# Patient Record
Sex: Male | Born: 1935 | Hispanic: No | Smoking: Light tobacco smoker
Health system: Southern US, Community
[De-identification: ages and names within clinical notes are randomized; demographics above are authoritative.]

## PROBLEM LIST (undated history)

## (undated) DIAGNOSIS — C801 Malignant (primary) neoplasm, unspecified: Secondary | ICD-10-CM

## (undated) DIAGNOSIS — A809 Acute poliomyelitis, unspecified: Secondary | ICD-10-CM

## (undated) DIAGNOSIS — N183 Chronic kidney disease, stage 3 unspecified: Secondary | ICD-10-CM

## (undated) DIAGNOSIS — I10 Essential (primary) hypertension: Secondary | ICD-10-CM

## (undated) DIAGNOSIS — A0472 Enterocolitis due to Clostridium difficile, not specified as recurrent: Secondary | ICD-10-CM

## (undated) HISTORY — DX: Acute poliomyelitis, unspecified: A80.9

## (undated) HISTORY — DX: Enterocolitis due to Clostridium difficile, not specified as recurrent: A04.72

## (undated) HISTORY — DX: Chronic kidney disease, stage 3 (moderate): N18.3

## (undated) HISTORY — DX: Chronic kidney disease, stage 3 unspecified: N18.30

---

## 1961-03-08 HISTORY — PX: OTHER SURGICAL HISTORY: SHX169

## 2005-02-24 ENCOUNTER — Inpatient Hospital Stay (HOSPITAL_COMMUNITY): Admission: EM | Admit: 2005-02-24 | Discharge: 2005-02-27 | Payer: Self-pay | Admitting: Emergency Medicine

## 2015-03-09 DIAGNOSIS — C801 Malignant (primary) neoplasm, unspecified: Secondary | ICD-10-CM

## 2015-03-09 HISTORY — DX: Malignant (primary) neoplasm, unspecified: C80.1

## 2015-03-27 ENCOUNTER — Other Ambulatory Visit (HOSPITAL_COMMUNITY): Payer: Self-pay | Admitting: Urology

## 2015-03-27 DIAGNOSIS — C61 Malignant neoplasm of prostate: Secondary | ICD-10-CM

## 2015-04-07 ENCOUNTER — Encounter (HOSPITAL_COMMUNITY)
Admission: RE | Admit: 2015-04-07 | Discharge: 2015-04-07 | Disposition: A | Discharge status: Home / Self Care | Payer: Medicare Other | Source: Ambulatory Visit | Attending: Urology | Admitting: Urology

## 2015-04-07 DIAGNOSIS — C61 Malignant neoplasm of prostate: Secondary | ICD-10-CM | POA: Diagnosis present

## 2015-04-07 MED ORDER — TECHNETIUM TC 99M MEDRONATE IV KIT
25.0000 | PACK | Freq: Once | INTRAVENOUS | Status: AC | PRN
  Administered 2015-04-07: 25 via INTRAVENOUS

## 2015-05-12 ENCOUNTER — Other Ambulatory Visit: Payer: Self-pay | Admitting: Urology

## 2015-05-15 MED ORDER — KETOROLAC TROMETHAMINE 30 MG/ML IJ SOLN: 30.0000 mg | Freq: Once | INTRAMUSCULAR | Status: AC

## 2015-06-05 ENCOUNTER — Encounter (HOSPITAL_BASED_OUTPATIENT_CLINIC_OR_DEPARTMENT_OTHER): Payer: Self-pay | Admitting: *Deleted

## 2015-06-05 NOTE — Progress Notes (Signed)
To [facility] at 0900-Istat on arrival-requested Ekg and last office visit note from [place] after [place]-stated Marcus Lane, plans to be with him day of surgery  and stay with him post op.

## 2015-06-08 NOTE — H&P (Signed)
Reason For Visit F/u   Active Problems Problems  1. Elevated prostate specific antigen (PSA) (R97.20)   Assessed By: Carolan Clines (Urology); Last Assessed: 07 May 2015 2. Nocturia (R35.1)   Assessed By: Carolan Clines (Urology); Last Assessed: 07 May 2015 3. Prostate cancer (C61)   Assessed By: Carolan Clines (Urology); Last Assessed: 07 May 2015 4. Urinary urgency (R39.15)   Assessed By: Carolan Clines (Urology); Last Assessed: 07 May 2015 5. Weak urinary stream (R39.12)  History of Present Illness     82 yo male returns today because Myrbetriq did not help with LUTS. ( IPSS= 19, QOL=6) He would like to proceed with cryotherapy for hx of adenocarcinoma of the prostate: PSA in 10/16 was 26.95. DRE revealed a 4+ prostate with significant right lobe nodularity and possible palpable extension on the right side.     TRUS/BX 03/21/15: Prostate measured 71 cc. Pathology: Adenocarcinoma involving nearly all of his prostate with 11/12 cores positive for Gleason score 4+4 = 8 in all cores with the percentage involved being quite high. Clinical stage: T2b possible T3a    LUTS: He reported having significant voiding symptoms consisting of intermittency, urgency and weak stream, frequency and nocturia with an IPSS of 21/QOL - terrible. The symptoms have become progressively worse over a 10 month period.    He saw Dr. Karsten Ro as 2nd opinion. He is currently taking dutasteride and tamsulosin and tells me that he has not seen a great deal of change in his voiding pattern. While he does have a weak stream is overriding voiding symptoms he reports to me are those of frequency, urgency, urge incontinence and nocturia 3-4 times. He has no dysuria or hematuria.   Past Medical History Problems  1. History of gastric ulcer (Z87.19) 2. History of hypercholesterolemia (Z86.39) 3. History of hypertension (Z86.79)  Surgical History Problems  1. History of No Surgical  Problems  Current Meds 1. Allergy Relief TABS;  Therapy: (Recorded:09Dec2016) to Recorded 2. DiazePAM 10 MG Oral Tablet; Take tablet 1 hour prior to procedure;  Therapy: [id number] to (Last Rx:09Dec2016) Ordered 3. Dutasteride-Tamsulosin HCl - 0.5-0.4 MG Oral Capsule; TAKE 1 CAPSULE EVERY DAY;  Therapy: [id number] to (Evaluate:07Jun2017)  Requested for: [id number]; Last  Rx:09Dec2016 Ordered 4. LevoFLOXacin 500 MG Oral Tablet; 1 tablet the day before the procedure, 1 tablet the day  of the procedure, and 1 tablet the day after the procedure;  Therapy: 12Dec2016 to (Last Rx:12Dec2016)  Requested for: 13Dec2016 Ordered 5. Lisinopril 20 MG Oral Tablet;  Therapy: (Recorded:09Dec2016) to Recorded 6. Lovastatin 20 MG Oral Tablet;  Therapy: (Recorded:09Dec2016) to Recorded 7. Vitamin D3 1000 UNIT Oral Tablet;  Therapy: (Recorded:09Dec2016) to Recorded  Allergies Medication  1. Allergy TABS  Family History Problems  1. Family history of Deceased : Mother, Father 2. Family history of diabetes mellitus (Z83.3) : Mother  Social History Problems  1. Alcohol use (Z78.9)   2 per day 2. Caffeine use (F15.90)   4 per day 3. Current every day smoker (F17.200) 4. Number of children   5 sons 5. Retired 83. Separated from significant other (Z63.5) 7. Smokes cigarettes (F17.210)   1 ppd x 4yr  Review of Systems Genitourinary and gastrointestinal system(s) were reviewed and pertinent findings if present are noted and are otherwise negative.  Genitourinary: urinary frequency, feelings of urinary urgency, nocturia, weak urinary stream and urinary stream starts and stops.    Vitals Vital Signs [Data Includes: Last 1 Day]  Recorded: [id number]:07AM  Blood Pressure: 175 /  87 Temperature: 98.2 F Heart Rate: 63  Physical Exam Constitutional: Well nourished and well developed . No acute distress.  ENT:. The ears and nose are normal in appearance.  Neck: The appearance of the neck is  normal and no neck mass is present.  Pulmonary: No respiratory distress and normal respiratory rhythm and effort.  Cardiovascular: Heart rate and rhythm are normal . No peripheral edema.  Abdomen: The abdomen is soft and nontender. No masses are palpated. No CVA tenderness. No hernias are palpable. No hepatosplenomegaly noted.  Rectal: Rectal exam demonstrates normal sphincter tone and the anus is normal on inspection. Estimated prostate size is 4+. Bilobar prostatic nodularity in 4+ gland. The prostate has a palpable nodule involving the entire right lobe  of the prostate and is concerning for extension beyond the prostate capsule, is not indurated, is not tender and is not fluctuant. The left seminal vesicle is nonpalpable. The right seminal vesicle is nonpalpable.  Genitourinary: Examination of the penis demonstrates no discharge, no masses, no lesions and a normal meatus. The scrotum is without lesions. The right epididymis is palpably normal and non-tender. The left epididymis is palpably normal and non-tender. The right testis is non-tender and without masses. The left testis is non-tender and without masses.  Lymphatics: The femoral and inguinal nodes are not enlarged or tender.  Skin: Normal skin turgor, no visible rash and no visible skin lesions.  Neuro/Psych:. Mood and affect are appropriate.    Results/Data Selected Results  CT-ABD/PELVIS W/W/O CONTRAST [id number] 12:00AM Carolan Clines   Test Name Result Flag Reference  CT-ABD/PELVIS W/W/O CONTRAST (Report)    ** RADIOLOGY REPORT BY [facility] **   CLINICAL DATA: Prostate cancer, elevated PSA, positive biopsy 03/11/2015.  EXAM: CT ABDOMEN AND PELVIS WITHOUT AND WITH CONTRAST  TECHNIQUE: Multidetector CT imaging of the abdomen and pelvis was performed following the standard protocol before and following the bolus administration of intravenous contrast.  CONTRAST: 100 cc Isovue-300.  COMPARISON:  None.  FINDINGS: Lower chest: Lung bases show no acute findings. Heart size normal. No pericardial or pleural effusion.  Hepatobiliary: Liver and gallbladder are unremarkable. No biliary ductal dilatation.  Pancreas: Negative.  Spleen: Negative.  Adrenals/Urinary Tract: Adrenal glands and kidneys are unremarkable. Ureters are decompressed. Anterior bladder wall may be slightly thickened but the bladder is low in volume.  Stomach/Bowel: Stomach, small bowel, appendix and colon are unremarkable. Note is made of stool in the majority of the colon, indicative of constipation. Small left inguinal hernia contains unobstructed small bowel.  Vascular/Lymphatic: Atherosclerotic calcification of the arterial vasculature without abdominal aortic aneurysm. There are scattered lymph nodes in the abdomen and pelvis which are not enlarged by CT size criteria.  Reproductive: Prostate is enlarged and indents the bladder.  Other: Left inguinal hernia contains unobstructed small bowel and fluid. No free fluid.  Musculoskeletal: No worrisome lytic or sclerotic lesions. Degenerative changes are seen in the spine.  IMPRESSION: 1. Enlarged prostate. No evidence of metastatic disease. 2. Left inguinal hernia contains unobstructed small bowel and a small amount of fluid.   Electronically Signed  By: Lorin Picket M.D.  On: 04/07/2015 12:58   CREATININE with eGFR [id number] 04:57PM Carolan Clines  SPECIMEN TYPE: BLOOD   Test Name Result Flag Reference  CREATININE 1.11 mg/dL  0.50-1.50  Est GFR, African American 72 mL/min  >=60  Est GFR, NonAfrican American 62 mL/min  >=60  THE ESTIMATED GFR IS A CALCULATION VALID FOR ADULTS (>=66 YEARS OLD) THAT USES THE CKD-EPI  ALGORITHM TO ADJUST FOR AGE AND SEX. IT IS   NOT TO BE USED FOR CHILDREN, PREGNANT WOMEN, HOSPITALIZED PATIENTS,    PATIENTS ON DIALYSIS, OR WITH RAPIDLY CHANGING KIDNEY FUNCTION. ACCORDING TO THE NKDEP, EGFR >89 IS NORMAL,  60-89 SHOWS MILD IMPAIRMENT, 30-59 SHOWS MODERATE IMPAIRMENT, 15-29 SHOWS SEVERE IMPAIRMENT AND <15 IS ESRD.   VITAMIN D [id number] 04:57PM Carolan Clines  SPECIMEN TYPE: BLOOD   Test Name Result Flag Reference  Vitamin D (25-Hydroxy) 27 ng/mL L 30-100  VITAMIN D STATUS           25-OH VITAMIN D        DEFICIENCY                <20 NG/ML        INSUFFICIENCY         20 - 29 NG/ML        OPTIMAL             > OR = 30 NG/ML   FOR 25-OH VITAMIN D TESTING ON PATIENTS ON D2-SUPPLEMENTATION AND PATIENTS FOR WHOM QUANTITATION OF D2 AND D3 FRACTIONS IS REQUIRED, THE QUESTASSURED 25-OH VIT D, (D2,D3), LC/MS/MS IS RECOMMENDED: ORDER CODE [id number] (PATIENTS > 2 YRS).   PSA [id number] 04:30PM Carolan Clines  SPECIMEN TYPE: BLOOD   Test Name Result Flag Reference  PSA 26.34 ng/mL H <=4.00  TEST METHODOLOGY: ECLIA PSA (ELECTROCHEMILUMINESCENCE IMMUNOASSAY)   TESTOSTERONE [id number] 04:30PM Carolan Clines  SPECIMEN TYPE: BLOOD   Test Name Result Flag Reference  TESTOSTERONE, TOTAL 488 ng/dL  300-890  TANNER STAGE       MALE              MALE               I              < 30 NG/DL        < 10 NG/DL               II             < 150 NG/DL       < 30 NG/DL               III            100-320 NG/DL     < 35 NG/DL               IV             200-970 NG/DL     15-40 NG/DL               V/ADULT        300-890 NG/DL     10-70 NG/DL   Assessment Assessed  1. Weak urinary stream (R39.12) 2. Urinary urgency (R39.15) 3. Nocturia (R35.1) 4. Prostate cancer (C61) 5. Elevated prostate specific antigen (PSA) (R97.20)  82 yo male with CaP. He has significant bladder outlet obstruction. He has had 2nd opinion to evaluate him for options, and he has selected cryotherapy. He understands that he will have a foley catheter for 2 days or more post-op, and RTC for voiding trial. He does not think his son will be able to return from [place] to be with him, so he plan to take [place] home from hospital.  ? needs overnight stay.   Plan Cryotherapy. Possible overnight stay.   Signatures Electronically signed by : Carolan Clines, M.D.; May 07 2015 10:09AM EST

## 2015-06-09 ENCOUNTER — Other Ambulatory Visit: Payer: Self-pay

## 2015-06-09 ENCOUNTER — Ambulatory Visit (HOSPITAL_BASED_OUTPATIENT_CLINIC_OR_DEPARTMENT_OTHER): Payer: Medicare Other | Admitting: Anesthesiology

## 2015-06-09 ENCOUNTER — Encounter (HOSPITAL_BASED_OUTPATIENT_CLINIC_OR_DEPARTMENT_OTHER): Payer: Self-pay | Admitting: Anesthesiology

## 2015-06-09 ENCOUNTER — Ambulatory Visit (HOSPITAL_BASED_OUTPATIENT_CLINIC_OR_DEPARTMENT_OTHER)
Admission: RE | Admit: 2015-06-09 | Discharge: 2015-06-09 | Disposition: A | Discharge status: Home / Self Care | Payer: Medicare Other | Source: Ambulatory Visit | Attending: Urology | Admitting: Urology

## 2015-06-09 ENCOUNTER — Encounter (HOSPITAL_BASED_OUTPATIENT_CLINIC_OR_DEPARTMENT_OTHER)
Admission: RE | Disposition: A | Discharge status: Home / Self Care | Payer: Self-pay | Source: Ambulatory Visit | Attending: Urology

## 2015-06-09 DIAGNOSIS — R350 Frequency of micturition: Secondary | ICD-10-CM | POA: Diagnosis not present

## 2015-06-09 DIAGNOSIS — N3941 Urge incontinence: Secondary | ICD-10-CM | POA: Diagnosis not present

## 2015-06-09 DIAGNOSIS — R351 Nocturia: Secondary | ICD-10-CM | POA: Diagnosis not present

## 2015-06-09 DIAGNOSIS — N401 Enlarged prostate with lower urinary tract symptoms: Secondary | ICD-10-CM | POA: Insufficient documentation

## 2015-06-09 DIAGNOSIS — Z79899 Other long term (current) drug therapy: Secondary | ICD-10-CM | POA: Insufficient documentation

## 2015-06-09 DIAGNOSIS — R972 Elevated prostate specific antigen [PSA]: Secondary | ICD-10-CM | POA: Diagnosis not present

## 2015-06-09 DIAGNOSIS — F17210 Nicotine dependence, cigarettes, uncomplicated: Secondary | ICD-10-CM | POA: Diagnosis not present

## 2015-06-09 DIAGNOSIS — E7800 Pure hypercholesterolemia, unspecified: Secondary | ICD-10-CM | POA: Diagnosis not present

## 2015-06-09 DIAGNOSIS — R3912 Poor urinary stream: Secondary | ICD-10-CM | POA: Insufficient documentation

## 2015-06-09 DIAGNOSIS — I10 Essential (primary) hypertension: Secondary | ICD-10-CM | POA: Insufficient documentation

## 2015-06-09 DIAGNOSIS — N138 Other obstructive and reflux uropathy: Secondary | ICD-10-CM | POA: Diagnosis not present

## 2015-06-09 DIAGNOSIS — C61 Malignant neoplasm of prostate: Secondary | ICD-10-CM | POA: Diagnosis present

## 2015-06-09 DIAGNOSIS — K4090 Unilateral inguinal hernia, without obstruction or gangrene, not specified as recurrent: Secondary | ICD-10-CM | POA: Insufficient documentation

## 2015-06-09 HISTORY — DX: Essential (primary) hypertension: I10

## 2015-06-09 HISTORY — PX: CRYOABLATION: SHX1415

## 2015-06-09 HISTORY — DX: Malignant (primary) neoplasm, unspecified: C80.1

## 2015-06-09 LAB — POCT I-STAT 4, (NA,K, GLUC, HGB,HCT)
GLUCOSE: 90 mg/dL (ref 65–99)
HEMATOCRIT: 43 % (ref 39.0–52.0)
HEMOGLOBIN: 14.6 g/dL (ref 13.0–17.0)
POTASSIUM: 3.1 mmol/L — AB (ref 3.5–5.1)
SODIUM: 143 mmol/L (ref 135–145)

## 2015-06-09 SURGERY — CRYOABLATION, PROSTATE
Anesthesia: General | Site: Prostate

## 2015-06-09 MED ORDER — ONDANSETRON HCL 4 MG/2ML IJ SOLN
INTRAMUSCULAR | Status: DC | PRN
  Administered 2015-06-09: 4 mg via INTRAVENOUS

## 2015-06-09 MED ORDER — EPHEDRINE SULFATE 50 MG/ML IJ SOLN
INTRAMUSCULAR | Status: DC | PRN
  Administered 2015-06-09 (×2): 10 mg via INTRAVENOUS

## 2015-06-09 MED ORDER — STERILE WATER FOR IRRIGATION IR SOLN
Status: DC | PRN
  Administered 2015-06-09: 500 mL

## 2015-06-09 MED ORDER — DEXAMETHASONE SODIUM PHOSPHATE 4 MG/ML IJ SOLN
INTRAMUSCULAR | Status: DC | PRN
  Administered 2015-06-09: 5 mg via INTRAVENOUS

## 2015-06-09 MED ORDER — BELLADONNA ALKALOIDS-OPIUM 16.2-60 MG RE SUPP
RECTAL | Status: AC
  Filled 2015-06-09: qty 1

## 2015-06-09 MED ORDER — DEXAMETHASONE SODIUM PHOSPHATE 10 MG/ML IJ SOLN
INTRAMUSCULAR | Status: AC
  Filled 2015-06-09: qty 1

## 2015-06-09 MED ORDER — LIDOCAINE HCL (CARDIAC) 20 MG/ML IV SOLN
INTRAVENOUS | Status: DC | PRN
  Administered 2015-06-09: 60 mg via INTRAVENOUS

## 2015-06-09 MED ORDER — KETOROLAC TROMETHAMINE 30 MG/ML IJ SOLN
INTRAMUSCULAR | Status: AC
  Filled 2015-06-09: qty 1

## 2015-06-09 MED ORDER — FENTANYL CITRATE (PF) 100 MCG/2ML IJ SOLN
INTRAMUSCULAR | Status: AC
  Filled 2015-06-09: qty 2

## 2015-06-09 MED ORDER — KETOROLAC TROMETHAMINE 30 MG/ML IJ SOLN
INTRAMUSCULAR | Status: DC | PRN
  Administered 2015-06-09: 15 mg via INTRAVENOUS

## 2015-06-09 MED ORDER — TRAMADOL HCL 50 MG PO TABS
ORAL_TABLET | ORAL | Status: AC
  Filled 2015-06-09: qty 1

## 2015-06-09 MED ORDER — TRIMETHOPRIM 100 MG PO TABS: 100.0000 mg | ORAL_TABLET | Freq: Every day | ORAL | Status: DC

## 2015-06-09 MED ORDER — PROPOFOL 10 MG/ML IV BOLUS
INTRAVENOUS | Status: DC | PRN
  Administered 2015-06-09: 120 mg via INTRAVENOUS
  Administered 2015-06-09: 10 mg via INTRAVENOUS

## 2015-06-09 MED ORDER — LIDOCAINE HCL (CARDIAC) 20 MG/ML IV SOLN
INTRAVENOUS | Status: AC
  Filled 2015-06-09: qty 5

## 2015-06-09 MED ORDER — ONDANSETRON HCL 4 MG/2ML IJ SOLN
INTRAMUSCULAR | Status: AC
  Filled 2015-06-09: qty 2

## 2015-06-09 MED ORDER — PHENAZOPYRIDINE HCL 100 MG PO TABS
ORAL_TABLET | ORAL | Status: AC
  Filled 2015-06-09: qty 2

## 2015-06-09 MED ORDER — PHENAZOPYRIDINE HCL 200 MG PO TABS
200.0000 mg | ORAL_TABLET | Freq: Once | ORAL | Status: AC
  Administered 2015-06-09: 200 mg via ORAL
  Filled 2015-06-09: qty 1

## 2015-06-09 MED ORDER — SODIUM CHLORIDE 0.9 % IR SOLN
Status: DC | PRN
  Administered 2015-06-09: 3000 mL
  Administered 2015-06-09: 1000 mL

## 2015-06-09 MED ORDER — FENTANYL CITRATE (PF) 100 MCG/2ML IJ SOLN
INTRAMUSCULAR | Status: DC | PRN
  Administered 2015-06-09 (×5): 25 ug via INTRAVENOUS
  Administered 2015-06-09: 50 ug via INTRAVENOUS
  Administered 2015-06-09: 25 ug via INTRAVENOUS

## 2015-06-09 MED ORDER — DEXTROSE 5 % IV SOLN
2.0000 g | INTRAVENOUS | Status: AC
  Administered 2015-06-09: 2 g via INTRAVENOUS
  Filled 2015-06-09: qty 20

## 2015-06-09 MED ORDER — TRAMADOL-ACETAMINOPHEN 37.5-325 MG PO TABS: 1.0000 | ORAL_TABLET | Freq: Four times a day (QID) | ORAL | Status: DC | PRN

## 2015-06-09 MED ORDER — CEFAZOLIN SODIUM-DEXTROSE 2-3 GM-% IV SOLR
INTRAVENOUS | Status: AC
  Filled 2015-06-09: qty 50

## 2015-06-09 MED ORDER — GLYCOPYRROLATE 0.2 MG/ML IJ SOLN
INTRAMUSCULAR | Status: DC | PRN
  Administered 2015-06-09: 0.2 mg via INTRAVENOUS

## 2015-06-09 MED ORDER — PHENAZOPYRIDINE HCL 200 MG PO TABS: 200.0000 mg | ORAL_TABLET | Freq: Three times a day (TID) | ORAL | Status: DC | PRN

## 2015-06-09 MED ORDER — BUPIVACAINE HCL (PF) 0.25 % IJ SOLN
INTRAMUSCULAR | Status: AC
  Filled 2015-06-09: qty 30

## 2015-06-09 MED ORDER — ACETAMINOPHEN 500 MG PO TABS
ORAL_TABLET | ORAL | Status: AC
  Filled 2015-06-09: qty 2

## 2015-06-09 MED ORDER — LACTATED RINGERS IV SOLN
INTRAVENOUS | Status: DC
  Administered 2015-06-09 (×2): via INTRAVENOUS
  Filled 2015-06-09: qty 1000

## 2015-06-09 MED ORDER — TRAMADOL HCL 50 MG PO TABS
50.0000 mg | ORAL_TABLET | Freq: Once | ORAL | Status: AC
  Administered 2015-06-09: 50 mg via ORAL
  Filled 2015-06-09: qty 1

## 2015-06-09 MED ORDER — FENTANYL CITRATE (PF) 100 MCG/2ML IJ SOLN
25.0000 ug | INTRAMUSCULAR | Status: DC | PRN
  Administered 2015-06-09: 50 ug via INTRAVENOUS
  Filled 2015-06-09: qty 1

## 2015-06-09 MED ORDER — PROPOFOL 10 MG/ML IV BOLUS
INTRAVENOUS | Status: AC
  Filled 2015-06-09: qty 20

## 2015-06-09 MED ORDER — EPHEDRINE SULFATE 50 MG/ML IJ SOLN
INTRAMUSCULAR | Status: AC
  Filled 2015-06-09: qty 1

## 2015-06-09 MED ORDER — ACETAMINOPHEN 500 MG PO TABS
1000.0000 mg | ORAL_TABLET | Freq: Four times a day (QID) | ORAL | Status: DC | PRN
  Administered 2015-06-09 (×2): 1000 mg via ORAL
  Filled 2015-06-09: qty 2

## 2015-06-09 SURGICAL SUPPLY — 38 items
BAG DRN ANRFLXCHMBR STRAP LEK (BAG)
BAG URINE DRAINAGE (UROLOGICAL SUPPLIES) IMPLANT
BAG URINE LEG 19OZ MD ST LTX (BAG) IMPLANT
BLADE CLIPPER SURG (BLADE) ×3 IMPLANT
BNDG CONFORM 3 STRL LF (GAUZE/BANDAGES/DRESSINGS) IMPLANT
BOOTIES KNEE HIGH SLOAN (MISCELLANEOUS) ×3 IMPLANT
CATH FOLEY 2WAY SLVR  5CC 16FR (CATHETERS) ×2
CATH FOLEY 2WAY SLVR 5CC 16FR (CATHETERS) IMPLANT
CHARGE TECH PROCEDURE ONCURA (LABOR (TRAVEL & OVERTIME)) ×3 IMPLANT
CLOTH BEACON ORANGE TIMEOUT ST (SAFETY) ×3 IMPLANT
COVER BACK TABLE 60X90IN (DRAPES) ×3 IMPLANT
COVER MAYO STAND STRL (DRAPES) ×3 IMPLANT
DRAPE INCISE IOBAN 66X45 STRL (DRAPES) ×3 IMPLANT
DRSG TEGADERM 4X4.75 (GAUZE/BANDAGES/DRESSINGS) ×3 IMPLANT
DRSG TEGADERM 8X12 (GAUZE/BANDAGES/DRESSINGS) ×3 IMPLANT
GAS ARGON HIGH PRESSURE (MEDICAL GASES) ×3 IMPLANT
GAS HELIUM HIGH PRESSURE (MEDICAL GASES) ×3 IMPLANT
GAUZE SPONGE 4X4 12PLY STRL (GAUZE/BANDAGES/DRESSINGS) IMPLANT
GLOVE BIO SURGEON STRL SZ7.5 (GLOVE) ×3 IMPLANT
GLOVE INDICATOR 7.5 STRL GRN (GLOVE) ×4 IMPLANT
GLOVE SURG SS PI 7.5 STRL IVOR (GLOVE) ×6 IMPLANT
GUIDEWIRE SUPER STIFF (WIRE) ×3 IMPLANT
HOLDER FOLEY CATH W/STRAP (MISCELLANEOUS) ×3 IMPLANT
KIT CRYO ENDOCARE (DISPOSABLE) ×2 IMPLANT
KIT PROSTATE PRESICE I (KITS) IMPLANT
KIT ROOM TURNOVER WOR (KITS) ×3 IMPLANT
MANIFOLD NEPTUNE II (INSTRUMENTS) IMPLANT
NEEDLE SPNL 18GX3.5 QUINCKE PK (NEEDLE) IMPLANT
PACK CYSTOSCOPY (CUSTOM PROCEDURE TRAY) ×3 IMPLANT
PLUG CATH AND CAP STER (CATHETERS) ×3 IMPLANT
SHEET LAVH (DRAPES) ×3 IMPLANT
SPONGE GAUZE 4X4 12PLY STER LF (GAUZE/BANDAGES/DRESSINGS) ×3 IMPLANT
SYRINGE 10CC LL (SYRINGE) ×2 IMPLANT
SYRINGE IRR TOOMEY STRL 70CC (SYRINGE) IMPLANT
TUBE CONNECTING 12'X1/4 (SUCTIONS)
TUBE CONNECTING 12X1/4 (SUCTIONS) IMPLANT
UNDERPAD 30X30 INCONTINENT (UNDERPADS AND DIAPERS) ×3 IMPLANT
WATER STERILE IRR 500ML POUR (IV SOLUTION) ×3 IMPLANT

## 2015-06-09 NOTE — Interval H&P Note (Signed)
History and Physical Interval Note:  06/09/2015 10:03 AM  Marcus Lane  has presented today for surgery, with the diagnosis of PROSTATE CANCER  The various methods of treatment have been discussed with the patient and family. After consideration of risks, benefits and other options for treatment, the patient has consented to  Procedure(s): CRYO ABLATION PROSTATE (N/A) as a surgical intervention .  The patient's history has been reviewed, patient examined, no change in status, stable for surgery.  I have reviewed the patient's chart and labs.  Questions were answered to the patient's satisfaction.     Praneel Haisley I Valin Massie

## 2015-06-09 NOTE — Anesthesia Preprocedure Evaluation (Addendum)
Anesthesia Evaluation  Patient identified by MRN, date of birth, ID band Patient awake    Reviewed: Allergy & Precautions, H&P , NPO status , Patient's Chart, lab work & pertinent test results  Airway Mallampati: II  TM Distance: >3 FB Neck ROM: full    Dental  (+) Dental Advisory Given, Edentulous Upper   Pulmonary neg pulmonary ROS, Current Smoker,    Pulmonary exam normal breath sounds clear to auscultation       Cardiovascular Exercise Tolerance: Good hypertension, Pt. on medications Normal cardiovascular exam Rhythm:regular Rate:Normal  Frequent PVCs and prolonged QT   Neuro/Psych negative neurological ROS  negative psych ROS   GI/Hepatic negative GI ROS, Neg liver ROS,   Endo/Other  negative endocrine ROS  Renal/GU negative Renal ROS  negative genitourinary   Musculoskeletal   Abdominal   Peds  Hematology negative hematology ROS (+)   Anesthesia Other Findings   Reproductive/Obstetrics negative OB ROS                            Anesthesia Physical Anesthesia Plan  ASA: III  Anesthesia Plan: General   Post-op Pain Management:    Induction: Intravenous  Airway Management Planned: LMA  Additional Equipment:   Intra-op Plan:   Post-operative Plan:   Informed Consent: I have reviewed the patients History and Physical, chart, labs and discussed the procedure including the risks, benefits and alternatives for the proposed anesthesia with the patient or authorized representative who has indicated his/her understanding and acceptance.   Dental Advisory Given  Plan Discussed with: CRNA and Surgeon  Anesthesia Plan Comments:         Anesthesia Quick Evaluation

## 2015-06-09 NOTE — Transfer of Care (Addendum)
Last Vitals:  Filed Vitals:   06/09/15 0929  BP: 152/79  Pulse: 61  Temp: 37.1 C  Resp: 18    Immediate Anesthesia Transfer of Care Note  Patient: Marcus Lane  Procedure(s) Performed: Procedure(s) (LRB): CRYO ABLATION PROSTATE (N/A)  Patient Location: PACU  Anesthesia Type: General  Level of Consciousness: awake, alert  and oriented  Airway & Oxygen Therapy: Patient Spontanous Breathing and Patient connected to nasal cannula oxygen  Post-op Assessment: Report given to PACU RN and Post -op Vital signs reviewed and stable  Post vital signs: Reviewed and stable  Complications: No apparent anesthesia complications

## 2015-06-09 NOTE — Anesthesia Procedure Notes (Signed)
Procedure Name: LMA Insertion Date/Time: 06/09/2015 10:49 AM Performed by: Mechele Claude Pre-anesthesia Checklist: Patient identified, Emergency Drugs available, Suction available and Patient being monitored Patient Re-evaluated:Patient Re-evaluated prior to inductionOxygen Delivery Method: Circle System Utilized Preoxygenation: Pre-oxygenation with 100% oxygen Intubation Type: IV induction Ventilation: Mask ventilation without difficulty LMA: LMA inserted LMA Size: 4.0 Number of attempts: 1 Airway Equipment and Method: bite block Placement Confirmation: positive ETCO2 Tube secured with: Tape Dental Injury: Teeth and Oropharynx as per pre-operative assessment

## 2015-06-09 NOTE — Interval H&P Note (Signed)
History and Physical Interval Note:  06/09/2015 10:03 AM  Marcus Lane  has presented today for surgery, with the diagnosis of PROSTATE CANCER  The various methods of treatment have been discussed with the patient and family. After consideration of risks, benefits and other options for treatment, the patient has consented to  Procedure(s): CRYO ABLATION PROSTATE (N/A) as a surgical intervention .  The patient's history has been reviewed, patient examined, no change in status, stable for surgery.  I have reviewed the patient's chart and labs.  Questions were answered to the patient's satisfaction.    NB: Margorie John wil be here to take patient home.   Tremain Rucinski I Natividad Halls

## 2015-06-09 NOTE — Op Note (Signed)
Pre-operative diagnosis :   cT2b CaP, gleason 8  Postoperative diagnosis: Same  Operation:  Cryotherapy Prostate  Surgeon:  S. Gaynelle Arabian, MD  First assistant: None  Anesthesia: General LMA  Preparation:  After appropriate preanesthesia, the patient was brought to the operating room, placed on the operating table in the dorsal supine position where general LMA anesthesia was introduced. He was then replaced in the dorsal lithotomy position with the pubis was prepped with Betadine solution and draped in usual fashion. The history was reviewed. The pathology biopsy was reviewed. The armband was double checked.  Review history:   Carolan Clines, MD Physician Signed Urology H&P 06/08/2015 4:26 PM    Expand All Collapse All    Reason For Visit F/u   Active Problems Problems  1. Elevated prostate specific antigen (PSA) (R97.20)  Assessed By: Carolan Clines (Urology); Last Assessed: 07 May 2015 2. Nocturia (R35.1)  Assessed By: Carolan Clines (Urology); Last Assessed: 07 May 2015 3. Prostate cancer (C61)  Assessed By: Carolan Clines (Urology); Last Assessed: 07 May 2015 4. Urinary urgency (R39.15)  Assessed By: Carolan Clines (Urology); Last Assessed: 07 May 2015 5. Weak urinary stream (R39.12)  History of Present Illness    82 yo male returns today because Myrbetriq did not help with LUTS. ( IPSS= 19, QOL=6) He would like to proceed with cryotherapy for hx of adenocarcinoma of the prostate: PSA in 10/16 was 26.95. DRE revealed a 4+ prostate with significant right lobe nodularity and possible palpable extension on the right side.     TRUS/BX 03/21/15: Prostate measured 71 cc. Pathology: Adenocarcinoma involving nearly all of his prostate with 11/12 cores positive for Gleason score 4+4 = 8 in all cores with the percentage involved being quite high. Clinical stage: T2b possible T3a    LUTS: He reported having significant voiding symptoms consisting  of intermittency, urgency and weak stream, frequency and nocturia with an IPSS of 21/QOL - terrible. The symptoms have become progressively worse over a 10 month period.    He saw Dr. Karsten Ro as 2nd opinion. He is currently taking dutasteride and tamsulosin and tells me that he has not seen a great deal of change in his voiding pattern. While he does have a weak stream is overriding voiding symptoms he reports to me are those of frequency, urgency, urge incontinence and nocturia 3-4 times. He has no dysuria or hematuria.   Past Medical History Problems  1. History of gastric ulcer (Z87.19) 2. History of hypercholesterolemia (Z86.39) 3. History of hypertension (Z86.79)  Surgical History Problems  1. History of No Surgical Problems  Current Meds 1. Allergy Relief TABS; Therapy: (Recorded:09Dec2016) to Recorded 2. Diazepam 10 MG Oral Tablet; Take tablet 1 hour prior to procedure; Therapy: [id number] to (Last Rx:09Dec2016) Ordered 3. Dutasteride-Tamsulosin HCl - 0.5-0.4 MG Oral Capsule; TAKE 1 CAPSULE EVERY DAY; Therapy: [id number] to (Evaluate:07Jun2017) Requested for: [id number]; Last Rx:09Dec2016 Ordered 4. LevoFLOXacin 500 MG Oral Tablet; 1 tablet the day before the procedure, 1 tablet the day of the procedure, and 1 tablet the day after the procedure; Therapy: 12Dec2016 to (Last Rx:12Dec2016) Requested for: 13Dec2016 Ordered 5. Lisinopril 20 MG Oral Tablet; Therapy: (Recorded:09Dec2016) to Recorded 6. Lovastatin 20 MG Oral Tablet; Therapy: (Recorded:09Dec2016) to Recorded 7. Vitamin D3 1000 UNIT Oral Tablet; Therapy: (Recorded:09Dec2016) to Recorded  Allergies Medication  1. Allergy TABS  Family History Problems  1. Family history of Deceased : Mother, Father 2. Family history of diabetes mellitus (Z83.3) : Mother  Social History Problems  1. Alcohol  use (Z78.9)  2 per day 2. Caffeine use (F15.90)  4 per day 3. Current every day smoker (F17.200) 4.  Number of children  5 sons 5. Retired 101. Separated from significant other (Z63.5) 7. Smokes cigarettes (F17.210)  1 ppd x 86yr  Review of Systems Genitourinary and gastrointestinal system(s) were reviewed and pertinent findings if present are noted and are otherwise negative.  Genitourinary: urinary frequency,feelings of urinary urgency,nocturia,weak urinary streamandurinary stream starts and stops.   Vitals Vital Signs [Data Includes: Last 1 Day]  Recorded: [id number]:07AM  Blood Pressure: 175 / 87 Temperature: 98.2 F Heart Rate: 63  Physical Exam Constitutional: Well nourishedandwell developed. No acute distress.  ENT:. The ears and nose are normal in appearance.  Neck: The appearance of the neck is normalandno neck mass is present.  Pulmonary: No respiratory distressandnormal respiratory rhythm and effort.  Cardiovascular: Heart rate and rhythm are normal. No peripheral edema.  Abdomen: The abdomen is soft and nontender. No masses are palpated. No CVA tenderness. No hernias are palpable. No hepatosplenomegaly noted.  Rectal: Rectal exam demonstrates normal sphincter toneandthe anus is normal on inspection. Estimated prostate size is 4+.Bilobar prostatic nodularity in 4+ gland. The prostate has a palpable nodule involving the entire right lobe of the prostate and is concerning for extension beyond the prostate capsule,is not indurated,is not tenderandis not fluctuant. The left seminal vesicle is nonpalpable. The right seminal vesicle is nonpalpable.  Genitourinary: Examination of the penis demonstrates no discharge,no masses,no lesionsanda normal meatus. The scrotum is without lesions. The right epididymis is palpably normalandnon-tender. The left epididymis is palpably normalandnon-tender. The right testis is non-tenderandwithout masses. The left testis is non-tenderandwithout masses.  Lymphatics: The femoral and inguinal nodes are not  enlarged or tender.  Skin: Normal skin turgor,no visible rashandno visible skin lesions.  Neuro/Psych:. Mood and affect are appropriate.   Results/Data Selected Results  CT-ABD/PELVIS W/W/O CONTRAST [id number]:00AM TCarolan Clines  Test Name Result Flag Reference  CT-ABD/PELVIS W/W/O CONTRAST (Report)    ** RADIOLOGY REPORT BY [facility] **  CLINICAL DATA: Prostate cancer, elevated PSA, positive biopsy 03/11/2015.  EXAM: CT ABDOMEN AND PELVIS WITHOUT AND WITH CONTRAST  TECHNIQUE: Multidetector CT imaging of the abdomen and pelvis was performed following the standard protocol before and following the bolus administration of intravenous contrast.  CONTRAST: 100 cc Isovue-300.  COMPARISON: None.  FINDINGS: Lower chest: Lung bases show no acute findings. Heart size normal. No pericardial or pleural effusion.  Hepatobiliary: Liver and gallbladder are unremarkable. No biliary ductal dilatation.  Pancreas: Negative.  Spleen: Negative.  Adrenals/Urinary Tract: Adrenal glands and kidneys are unremarkable. Ureters are decompressed. Anterior bladder wall may be slightly thickened but the bladder is low in volume.  Stomach/Bowel: Stomach, small bowel, appendix and colon are unremarkable. Note is made of stool in the majority of the colon, indicative of constipation. Small left inguinal hernia contains unobstructed small bowel.  Vascular/Lymphatic: Atherosclerotic calcification of the arterial vasculature without abdominal aortic aneurysm. There are scattered lymph nodes in the abdomen and pelvis which are not enlarged by CT size criteria.  Reproductive: Prostate is enlarged and indents the bladder.  Other: Left inguinal hernia contains unobstructed small bowel and fluid. No free fluid.  Musculoskeletal: No worrisome lytic or sclerotic lesions. Degenerative changes are seen in the spine.  IMPRESSION: 1. Enlarged prostate. No  evidence of metastatic disease. 2. Left inguinal hernia contains unobstructed small bowel and a small amount of fluid.   Electronically Signed By: MLorin PicketM.D. On:  04/07/2015 12:58   CREATININE with eGFR [id number] 04:57PM Carolan Clines  SPECIMEN TYPE: BLOOD   Test Name Result Flag Reference  CREATININE 1.11 mg/dL  0.50-1.50  Est GFR, African American 72 mL/min  >=60  Est GFR, NonAfrican American 62 mL/min  >=60  THE ESTIMATED GFR IS A CALCULATION VALID FOR ADULTS (>=63 YEARS OLD) THAT USES THE CKD-EPI ALGORITHM TO ADJUST FOR AGE AND SEX. IT IS  NOT TO BE USED FOR CHILDREN, PREGNANT WOMEN, HOSPITALIZED PATIENTS,  PATIENTS ON DIALYSIS, OR WITH RAPIDLY CHANGING KIDNEY FUNCTION. ACCORDING TO THE NKDEP, EGFR >89 IS NORMAL, 60-89 SHOWS MILD IMPAIRMENT, 30-59 SHOWS MODERATE IMPAIRMENT, 15-29 SHOWS SEVERE IMPAIRMENT AND <15 IS ESRD.   VITAMIN D [id number] 04:57PM Carolan Clines  SPECIMEN TYPE: BLOOD   Test Name Result Flag Reference  Vitamin D (25-Hydroxy) 27 ng/mL L 30-100  VITAMIN D STATUS 25-OH VITAMIN D  DEFICIENCY <20 NG/ML  INSUFFICIENCY 20 - 29 NG/ML  OPTIMAL > OR = 30 NG/ML  FOR 25-OH VITAMIN D TESTING ON PATIENTS ON D2-SUPPLEMENTATION AND PATIENTS FOR WHOM QUANTITATION OF D2 AND D3 FRACTIONS IS REQUIRED, THE QUESTASSURED 25-OH VIT D, (D2,D3), LC/MS/MS IS RECOMMENDED: ORDER CODE [id number] (PATIENTS > 2 YRS).   PSA [id number] 04:30PM Carolan Clines  SPECIMEN TYPE: BLOOD   Test Name Result Flag Reference  PSA 26.34 ng/mL H <=4.00  TEST METHODOLOGY: ECLIA PSA (ELECTROCHEMILUMINESCENCE IMMUNOASSAY)   TESTOSTERONE [id number] 04:30PM Carolan Clines  SPECIMEN TYPE: BLOOD   Test Name Result Flag Reference  TESTOSTERONE, TOTAL 488 ng/dL  300-890  TANNER STAGE MALE MALE   I < 30 NG/DL < 10 NG/DL  II < 150 NG/DL < 30 NG/DL  III 100-320 NG/DL < 35 NG/DL  IV 200-970 NG/DL 15-40 NG/DL  V/ADULT 300-890 NG/DL 10-70 NG/DL   Assessment Assessed  1. Weak urinary stream (R39.12) 2. Urinary urgency (R39.15) 3. Nocturia (R35.1) 4. Prostate cancer (C61) 5. Elevated prostate specific antigen (PSA) (R97.20)  82 yo male with CaP. He has significant bladder outlet obstruction. He has had 2nd opinion to evaluate him for options, and he has selected cryotherapy. He understands that he will have a foley catheter for 2 days or more post-op, and RTC for voiding trial. He does not think his son will be able to return from [place] to be with him, so he plan to take Finney home from hospital. ? needs overnight stay.   Plan Cryotherapy. Possible overnight stay.   Signatures Electronically signed by : Carolan Clines, M.D.; May 07 2015 10:09AM EST        Routing History     Date/Time From To Method   06/08/2015 4:27 PM Carolan Clines, MD Carolan Clines, MD Fax   06/08/2015 4:27 PM Carolan Clines, MD Pcp Not In System In Basket            Statement of  Likelihood of Success: Excellent. TIME-OUT observed.:  Procedure:  Prostate ultrasound showed a 70 g prostate. Cystoscopy had shown a small intravesical median lobe. Review of CT showed no cancer outside the prostate, and no pelvic lymph nodes.  Using the transrectal ultrasound probe in real-time modem, 6 separate cryoprobes were placed in 3 rows. Cystoscopy revealed no evidence of needles within the prostatic fossa, or within the bladder. A guidewire was then passed through the cystoscope, and the flexible cystoscope was removed. Over the guidewire, the urethral warming device was placed under ultrasound guidance. Following placement, the urethral warming device was  activated, and remained in position for 20 minutes following the  final thaw.  The patient then underwent 2 separate freeze-thaw cycles, using active argon/helium gas for the 1st  active freeze/thaw cycle,; and also for the active portion of the 2nd freeze-thaw cycle. The final portion of the 2nd thaw cycle was passive, in order to ensure maximum cell fractionation. Care was taken to avoid any thermal damage to the urethra, or to Denonvilliers'  fascia. This was accomplished by using thermocouple devices to check temperatures throughout the case.  Following the second cycle, urethral warming was accomplished for 20 extra minutes, prior to removing the urethral warming device. The cryoprobes were removed when they were thawed. A 16 French Foley catheter was in place to straight drainage with 10 mL of sterile water in balloon. A B and O suppository was placed. Sterile dressing was placed, with pressure dressing. The patient was given one half dose of IV Toradol. He was awakened and taken to recovery room in good condition. Urine was clear.

## 2015-06-09 NOTE — Anesthesia Postprocedure Evaluation (Signed)
Anesthesia Post Note  Patient: Marcus Lane  Procedure(s) Performed: Procedure(s) (LRB): CRYO ABLATION PROSTATE (N/A)  Patient location during evaluation: PACU Anesthesia Type: General Level of consciousness: awake and alert Pain management: pain level controlled Vital Signs Assessment: post-procedure vital signs reviewed and stable Respiratory status: spontaneous breathing, nonlabored ventilation, respiratory function stable and patient connected to nasal cannula oxygen Cardiovascular status: blood pressure returned to baseline and stable Postop Assessment: no signs of nausea or vomiting Anesthetic complications: no    Last Vitals:  Filed Vitals:   06/09/15 1330 06/09/15 1345  BP: 178/73 160/83  Pulse: 57 30  Temp:    Resp: 20 18    Last Pain:  Filed Vitals:   06/09/15 1356  PainSc: Asleep                 Bryan Goin L

## 2015-06-09 NOTE — Discharge Instructions (Addendum)
Braquiterapia para el cncer de prstata, cuidados posteriores (Brachytherapy for Prostate Cancer, Care After) Siga estas instrucciones durante las prximas semanas. Estas indicaciones le proporcionan informacin general acerca de cmo deber cuidarse despus del procedimiento. El mdico tambin podr darle instrucciones ms especficas. El tratamiento ha sido planificado segn las prcticas mdicas actuales, pero en algunos casos pueden ocurrir problemas. Comunquese con el mdico si tiene algn problema o tiene dudas despus del procedimiento. QU ESPERAR DESPUS DEL PROCEDIMIENTO Es probable que sienta dolor y haya un hematoma en la zona que se encuentra detrs del escroto. Durante un breve perodo, podr:  Tener dificultad para orinar. Podra necesitar de un catter [employer] o un mes.  Notar sangre o semen en la orina.  Tener sensacin de estreimiento debido a la hinchazn de Animal nutritionist.  Sentir con frecuencia que Ship broker. Durante un perodo prolongado podr tener: 1. Inflamacin del recto. Esto ocurre en aproximadamente el 2% de las personas que pasan por este procedimiento. 2. Problemas de ereccin. Esto depende de la edad y ocurre en aproximadamente el 15% al 40% de los hombres. 3. Dificultad para orinar. Esto se debe a Scientist, product/process development. 4. Diarrea. INSTRUCCIONES PARA EL CUIDADO EN EL HOGAR  1. Tome los medicamentos solamente como se lo haya indicado el mdico. 2. Es probable que tenga un catter en la vejiga durante [employer]. Habr sangre en la [place]ols[place] de [place] y tendr que beber gran cantidad de lquidos para mantenerla de un color rojo claro. 3. Concurra a todas las visitas de control como se lo haya indicado el mdico. Si tiene un catter, se lo quitarn durante una de estas visitas. 4. Trate de no sentarse directamente sobre el rea que se encuentra detrs del escroto. Un almohadn blando puede [employer]. Unas  compresas con hielo tambin [employer]No aplique hielo directamente sobre la piel. 5. Tome una ducha y lave suavemente la zona que se encuentra detrs del escroto. No se siente en la baera. 6. Si le han aplicado semillas radiactivas para la braquiterapia, limite el contacto cercano con nios y mujeres embarazadas durante 2 meses, debido a que la radiacin permanece en la prstata. Despus de [employer], los niveles descienden rpidamente. SOLICITE ATENCIN MDICA DE INMEDIATO SI:  1. Tiene fiebre. 2. Tiene escalofros. 3. Le falta el aire. 4. Siente dolor en el pecho. 5. Observa sangre espesa, similar a jugo de [employer] [place]. 6. El catter se obstruye y la orina no puede entrar a Therapist, nutritional. La zona de la vejiga o la parte inferior del abdomen pueden estar hinchados. 7. Hay un sangrado excesivo en el recto. Es normal que haya un poco de sangre mezclada con la materia fecal. 8. Siente molestias intensas en la zona tratada que no desaparece con los analgsicos. 9. Tiene molestias abdominales. 10. Tiene nuseas o vmitos intensos. 11. Desarrolla sntomas nuevos o inusuales.   Esta informacin no tiene Marine scientist el consejo del mdico. Asegrese de hacerle al mdico cualquier pregunta que tenga.      INSTRUCTIONS AFTER CRYOABLATION OF THE PROSTATE  Normal Findings After Cryoablation:   You may experience a number of symptoms after the cryoablation which occur in some patients.  There is no cause for alarm should this happen.  These symptoms include:  -Blood in the urine.  When you are discharged after your surgery, your urine will have some blood in it and may appear red.  This occurs to  some degree in all patients after cryoablation and is not cause for alarm.  Your urine should clear approximately 24 hours after the procedure, but may persist for quite a while longer.  -Scrotal and penile swelling and bruising.  This occurs about 2-3 days after the  cryoablation and is caused by tissue swelling which temporarily blocks the drainage of lymph.  This is painless and resolves in less than one week.  Ice packs and lying down for short periods of time during the day will improve the swelling.  -Small amounts of bloody discharge from the end of your penis.  This can  occur for up to 6 weeks after the procedure and is not cause for alarm.  It is due  to some discharge from the urethra in the area of the prostate.  -Some numbness in the head of the penis.  Occasionally, when a large amount of freezing has been performed during the procedure, the nerve which supplies sensation to one or both sides of the head of the penis may be affected. The sensation returns after a number of months.  Call your doctor at [phone] if any of the following occur:               -If you have any pain, fever, or chills.  -If your foley catheter or suprapubic tube is not draining urine.  -If there is decreasing urinary stream.  This may indicate sloughing of some  dead tissue in the area of the prostate near the opening to the bladder.  It may clear on its own but,  if the problem becomes severe, it may require removal of the dead tissue through a cystoscope.  -If you have diarrhea after urination or foul-smelling urine.  These  symptoms may indicate an urethrorectal fistula, which is a hole between the bladder channel and the rectum.  This should be investigated by your doctor.  -If you have any questions or problems.   Diet:  Resume your normal diet.   If you become constipated, you may try  over-the-counter remedies such as Milk of Magnesia.  If you are nauseated, vomiting or feel bloated, notify your doctor's office.  DO NOT GIVE YOURSELF ANY ENEMAS OR RECTAL MEDICATIONS.  THE RECTAL WALL IS THIN AFTER CRYOABLATION.  Activity:   -You may have swelling and bruising of the penis and scrotum.  Apply ice packs  to the area behind the scrotum intermittently for 24-48 hours  after your surgery to keep the swelling down.  Wearing an athletic supporter (jock strap) might also help.  -Lying flat on your back will also help decrease the swelling.  You may find when you are sitting up or walking around that the swelling increases.   -You will have  puncture wounds behind your scrotum making it painful to sit   down.  Use a hemorrhoid donut to sit on if needed.  -You may shower on the second day after surgery.  -There are no lifting or driving restrictions.  Use caution while the suprapubic tube   is in place.   Medications:  -You may resume your preoperative medications, except aspirin and other blood   thinning agents.  Unless otherwise instructed, resume your aspirin after your suprapubic tube is removed.  If you are taking Coumadin or other blood thinners, please discuss when to restart these medications with your surgeon.  -Unless otherwise ordered, you will be given prescriptions for the following  medications which you will need to take after  your procedure:   *Antibiotics -  to prevent infection while your suprapubic tube is in place.   *Anti-inflammatory - to prevent pain and to decrease the inflammation in  the area of the prostate.  You may take ibuprofen (Motrin, Advil),acetaminophen (Tylenol) or naproxen (Aleve) as needed for discomfort.   Call your doctor's office at [phone] for an appointment.   Foley Catheter Care, Adult A Foley catheter is a soft, flexible tube that is placed into the bladder to drain urine. A Foley catheter may be inserted if:  You leak urine or are not able to control when you urinate (urinary incontinence).  You are not able to urinate when you need to (urinary retention).  You had prostate surgery or surgery on the genitals.  You have certain medical conditions, such as multiple sclerosis, dementia, or a spinal cord injury. If you are going home with a Foley catheter in place, follow the instructions below. TAKING CARE OF  THE CATHETER 5. Wash your hands with soap and water. 6. Using mild soap and warm water on a clean washcloth:  Clean the area on your body closest to the catheter insertion site using a circular motion, moving away from the catheter. Never wipe toward the catheter because this could sweep bacteria up into the urethra and cause infection.  Remove all traces of soap. Pat the area dry with a clean towel. For males, reposition the foreskin. 7. Attach the catheter to your leg so there is no tension on the catheter. Use adhesive tape or a leg strap. If you are using adhesive tape, remove any sticky residue left behind by the previous tape you used. 8. Keep the drainage bag below the level of the bladder, but keep it off the floor. 9. Check throughout the day to be sure the catheter is working and urine is draining freely. Make sure the tubing does not become kinked. 10. Do not pull on the catheter or try to remove it. Pulling could damage internal tissues. TAKING CARE OF THE DRAINAGE BAGS You will be given two drainage bags to take home. One is a large overnight drainage bag, and the other is a smaller leg bag that fits underneath clothing. You may wear the overnight bag at any time, but you should never wear the smaller leg bag at night. Follow the instructions below for how to empty, change, and clean your drainage bags. Emptying the Drainage Bag You must empty your drainage bag when it is  - full or at least 2-3 times a day. 7. Wash your hands with soap and water. 8. Keep the drainage bag below your hips, below the level of your bladder. This stops urine from going back into the tubing and into your bladder. 9. Hold the dirty bag over the toilet or a clean container. 10. Open the pour spout at the bottom of the bag and empty the urine into the toilet or container. Do not let the pour spout touch the toilet, container, or any other surface. Doing so can place bacteria on the bag, which can cause an  infection. 11. Clean the pour spout with a gauze pad or cotton ball that has rubbing alcohol on it. 12. Close the pour spout. 13. Attach the bag to your leg with adhesive tape or a leg strap. 14. Wash your hands well. Changing the Drainage Bag Change your drainage bag once a month or sooner if it starts to smell bad or look dirty. Below are steps to follow when  changing the drainage bag. 12. Wash your hands with soap and water. 13. Pinch off the rubber catheter so that urine does not spill out. 98. Disconnect the catheter tube from the drainage tube at the connection valve. Do not let the tubes touch any surface. 15. Clean the end of the catheter tube with an alcohol wipe. Use a different alcohol wipe to clean the end of the drainage tube. 16. Connect the catheter tube to the drainage tube of the clean drainage bag. 17. Attach the new bag to the leg with  a leg strap. Avoid attaching the new bag too tightly. PREVENTING INFECTION  Wash your hands before and after handling your catheter.  Take showers daily and wash the area where the catheter enters your body. Do not take baths. Replace wet leg straps with dry ones, if this applies.  Do not use powders, sprays, or lotions on the genital area. Only use creams, lotions, or ointments as directed by your caregiver.  For females, wipe from front to back after each bowel movement.  Drink enough fluids to keep your urine clear or pale yellow unless you have a fluid restriction.  Do not let the drainage bag or tubing touch or lie on the floor.  Wear cotton underwear to absorb moisture and to keep your skin drier. SEEK MEDICAL CARE IF:   Your urine is cloudy or smells unusually bad.  Your catheter becomes clogged.  You are not draining urine into the bag or your bladder feels full.  Your catheter starts to leak. SEEK IMMEDIATE MEDICAL CARE IF:   You have pain, swelling, redness, or pus where the catheter enters the body.  You have pain  in the abdomen, legs, lower back, or bladder.  You have a fever.  You see blood fill the catheter, or your urine is pink or red.  You have nausea, vomiting, or chills.  Your catheter gets pulled out. MAKE SURE YOU:   Understand these instructions.  Will watch your condition.  Will get help right away if you are not doing well or get worse.   This information is not intended to replace advice given to you by your health care provider. Make sure you discuss any questions you have with your health care provider.    Post Anesthesia Home Care Instructions  Activity: Get plenty of rest for the remainder of the day. A responsible adult should stay with you for 24 hours following the procedure.  For the next 24 hours, DO NOT: -Drive a car -Paediatric nurse -Drink alcoholic beverages -Take any medication unless instructed by your physician -Make any legal decisions or sign important papers.  Meals: Start with liquid foods such as gelatin or soup. Progress to regular foods as tolerated. Avoid greasy, spicy, heavy foods. If nausea and/or vomiting occur, drink only clear liquids until the nausea and/or vomiting subsides. Call your physician if vomiting continues.  Special Instructions/Symptoms: Your throat may feel dry or sore from the anesthesia or the breathing tube placed in your throat during surgery. If this causes discomfort, gargle with warm salt water. The discomfort should disappear within 24 hours.  If you had a scopolamine patch placed behind your ear for the management of post- operative nausea and/or vomiting:  1. The medication in the patch is effective for 72 hours, after which it should be removed.  Wrap patch in a tissue and discard in the trash. Wash hands thoroughly with soap and water. 2. You may remove the patch earlier than  72 hours if you experience unpleasant side effects which may include dry mouth, dizziness or visual disturbances. 3. Avoid touching the patch.  Wash your hands with soap and water after contact with the patch.

## 2015-06-10 ENCOUNTER — Encounter (HOSPITAL_BASED_OUTPATIENT_CLINIC_OR_DEPARTMENT_OTHER): Payer: Self-pay | Admitting: Urology

## 2015-06-14 ENCOUNTER — Encounter (HOSPITAL_COMMUNITY): Payer: Self-pay | Admitting: Emergency Medicine

## 2015-06-14 ENCOUNTER — Emergency Department (HOSPITAL_COMMUNITY)
Admission: EM | Admit: 2015-06-14 | Discharge: 2015-06-14 | Disposition: A | Discharge status: Home / Self Care | Payer: Medicare Other | Attending: Emergency Medicine | Admitting: Emergency Medicine

## 2015-06-14 DIAGNOSIS — Z79899 Other long term (current) drug therapy: Secondary | ICD-10-CM | POA: Insufficient documentation

## 2015-06-14 DIAGNOSIS — R339 Retention of urine, unspecified: Secondary | ICD-10-CM | POA: Diagnosis not present

## 2015-06-14 DIAGNOSIS — K5900 Constipation, unspecified: Secondary | ICD-10-CM | POA: Diagnosis not present

## 2015-06-14 DIAGNOSIS — N4889 Other specified disorders of penis: Secondary | ICD-10-CM | POA: Diagnosis not present

## 2015-06-14 DIAGNOSIS — I10 Essential (primary) hypertension: Secondary | ICD-10-CM | POA: Insufficient documentation

## 2015-06-14 DIAGNOSIS — Z8546 Personal history of malignant neoplasm of prostate: Secondary | ICD-10-CM | POA: Diagnosis not present

## 2015-06-14 DIAGNOSIS — F17200 Nicotine dependence, unspecified, uncomplicated: Secondary | ICD-10-CM | POA: Diagnosis not present

## 2015-06-14 MED ORDER — POLYETHYLENE GLYCOL 3350 17 GM/SCOOP PO POWD: ORAL | Status: DC

## 2015-06-14 MED ORDER — DOCUSATE SODIUM 100 MG PO CAPS: 100.0000 mg | ORAL_CAPSULE | Freq: Two times a day (BID) | ORAL | Status: DC

## 2015-06-14 MED ORDER — ONDANSETRON 4 MG PO TBDP
4.0000 mg | ORAL_TABLET | Freq: Once | ORAL | Status: AC
  Administered 2015-06-14: 4 mg via ORAL
  Filled 2015-06-14: qty 1

## 2015-06-14 MED ORDER — MAGNESIUM CITRATE PO SOLN
1.0000 | Freq: Once | ORAL | Status: AC
  Administered 2015-06-14: 1 via ORAL
  Filled 2015-06-14: qty 296

## 2015-06-14 NOTE — ED Notes (Signed)
Info by nurse wrong order have been place. Wait for new order before collecting labs

## 2015-06-14 NOTE — ED Notes (Signed)
Dr Rex Kras at bedside with an ultrasound.

## 2015-06-14 NOTE — Consult Note (Signed)
Subjective: CC: I can't pee.  Hx: Marcus Lane is an 82 yo male who recently had cryoablation therapy for prostate cancer.  He had his foley removed yesterday at 84 and has not voided since.   He has suprapubic pain and a bladder scan showed about 1079ml.   ROS:  Review of Systems  Constitutional: Negative for fever.  Gastrointestinal: Positive for abdominal pain and constipation.  Genitourinary:       Penile swelling and echymosis.     No Known Allergies  Past Medical History  Diagnosis Date  . Hypertension   . Cancer [facility]) 2017    prostate    Past Surgical History  Procedure Laterality Date  . Orif arm Right 1963    accident w/surgical repair-rt arm  . Cryoablation N/A 06/09/2015    Procedure: CRYO ABLATION PROSTATE;  Surgeon: Carolan Clines, MD;  Location: [facility];  Service: Urology;  Laterality: N/A;    Social History   Social History  . Marital Status: Legally Separated    Spouse Name: N/A  . Number of Children: N/A  . Years of Education: N/A   Occupational History  . Not on file.   Social History Main Topics  . Smoking status: Light Tobacco Smoker -- 0.25 packs/day  . Smokeless tobacco: Not on file  . Alcohol Use: 4.8 oz/week    8 Cans of beer per week  . Drug Use: No  . Sexual Activity: Not on file   Other Topics Concern  . Not on file   Social History Narrative    History reviewed. No pertinent family history.  Anti-infectives: Anti-infectives    None      Current Facility-Administered Medications  Medication Dose Route Frequency Provider Last Rate Last Dose  . magnesium citrate solution 1 Bottle  1 Bottle Oral Once Wenda Overland Little, MD      . ondansetron (ZOFRAN-ODT) disintegrating tablet 4 mg  4 mg Oral Once Sharlett Iles, MD       Current Outpatient Prescriptions  Medication Sig Dispense Refill  . acetaminophen (TYLENOL) 500 MG tablet Take 500-1,000 mg by mouth daily as needed for moderate pain.    .  cholecalciferol (VITAMIN D) 1000 units tablet Take 1,000 Units by mouth daily.    . Dutasteride-Tamsulosin HCl 0.5-0.4 MG CAPS Take 1 capsule by mouth daily.  1  . lisinopril (PRINIVIL,ZESTRIL) 20 MG tablet Take 20 mg by mouth daily.    Marland Kitchen lovastatin (MEVACOR) 20 MG tablet Take 20 mg by mouth at bedtime.    . ondansetron (ZOFRAN-ODT) 4 MG disintegrating tablet Take 1 tablet by mouth every 6 (six) hours as needed. Nausea or vomiting  0  . phenazopyridine (PYRIDIUM) 200 MG tablet Take 1 tablet (200 mg total) by mouth 3 (three) times daily as needed for pain. 30 tablet 3  . traMADol-acetaminophen (ULTRACET) 37.5-325 MG tablet Take 1 tablet by mouth every 6 (six) hours as needed for moderate pain. 30 tablet 2  . trimethoprim (TRIMPEX) 100 MG tablet Take 1 tablet (100 mg total) by mouth daily. Take 1 tablet daily 30 tablet 1     Objective: Vital signs in last 24 hours: Temp:  [97.4 F (36.3 C)] 97.4 F (36.3 C) (04/08 0928) Pulse Rate:  [73] 73 (04/08 0928) Resp:  [16] 16 (04/08 0928) BP: (130)/(82) 130/82 mmHg (04/08 0928) SpO2:  [98 %] 98 % (04/08 0928)  Intake/Output from previous day:   Intake/Output this shift:     Physical Exam  Constitutional: He is oriented to person, place, and time and well-developed, well-nourished, and in no distress.  Abdominal: He exhibits mass (suprapubic consistent with a distended bladder. ).  Genitourinary:  He has moderate penile edema with phimosis and ecchymosis of the penis, mons and scrotum  Neurological: He is alert and oriented to person, place, and time.    Lab Results:  No results for input(s): WBC, HGB, HCT, PLT in the last 72 hours. BMET No results for input(s): NA, K, CL, CO2, GLUCOSE, BUN, CREATININE, CALCIUM in the last 72 hours. PT/INR No results for input(s): LABPROT, INR in the last 72 hours. ABG No results for input(s): PHART, HCO3 in the last 72 hours.  Invalid input(s): PCO2, PO2  Studies/Results: No results found.  I  discussed the case with Marcus Lane.  Procedure:  An 65fr coude foley was placed after a betadine prep and urethral lubrication.  There was some resistance at the level of the prostate but the catheter passed without difficulty.  There was a return of about 927ml of pyridium stained urine.  He was given a bedside bag and leg bag.  Assessment: Urinary retention post cryotherapy for prostate cancer.    Leave foley to drainage and f/u with Marcus Lane.   With the degree of distention he will probably need to foley for a couple of weeks.     CC: Christella Noa MD and Dr. Katrine Coho.      Marcus Lane J 06/14/2015 [phone]

## 2015-06-14 NOTE — ED Notes (Signed)
Pt reports he had a prostate procedure on Friday. Had a urinary catheter removed afterwards and now cannot urinate.

## 2015-06-14 NOTE — ED Provider Notes (Signed)
CSN: [id number]     Arrival date & time 06/14/15  [id number] History   First MD Initiated Contact with Patient 06/14/15 [phone]     Chief Complaint  Patient presents with  . Urinary Retention     (Consider location/radiation/quality/duration/timing/severity/associated sxs/prior Treatment) HPI Comments: 82yo M w/ PMH including HTN and prostate CA who presents with urinary retention. 5 days ago, the patient had a surgical procedure on his prostate for prostate cancer. Yesterday, he followed up in the clinic and had his urinary catheter removed. He has not been able to urinate since 10 AM yesterday and endorses significant discomfort due to bladder distention. He had an episode of vomiting last night and endorses nausea currently. He denies any fevers. He has noticed some bruising in his groin. He reports constipation since the surgery and took milk of magnesia with no relief.  The history is provided by the patient.    Past Medical History  Diagnosis Date  . Hypertension   . Cancer [facility]) 2017    prostate   Past Surgical History  Procedure Laterality Date  . Orif arm Right 1963    accident w/surgical repair-rt arm  . Cryoablation N/A 06/09/2015    Procedure: CRYO ABLATION PROSTATE;  Surgeon: Carolan Clines, MD;  Location: [facility];  Service: Urology;  Laterality: N/A;   History reviewed. No pertinent family history. Social History  Substance Use Topics  . Smoking status: Light Tobacco Smoker -- 0.25 packs/day  . Smokeless tobacco: None  . Alcohol Use: 4.8 oz/week    8 Cans of beer per week    Review of Systems 10 Systems reviewed and are negative for acute change except as noted in the HPI.    Allergies  Review of patient's allergies indicates no known allergies.  Home Medications   Prior to Admission medications   Medication Sig Start Date End Date Taking? Authorizing Provider  acetaminophen (TYLENOL) 500 MG tablet Take 500-1,000 mg by mouth daily as needed  for moderate pain.   Yes Historical Provider, MD  cholecalciferol (VITAMIN D) 1000 units tablet Take 1,000 Units by mouth daily.   Yes Historical Provider, MD  Dutasteride-Tamsulosin HCl 0.5-0.4 MG CAPS Take 1 capsule by mouth daily. 04/21/15  Yes Historical Provider, MD  lisinopril (PRINIVIL,ZESTRIL) 20 MG tablet Take 20 mg by mouth daily.   Yes Historical Provider, MD  lovastatin (MEVACOR) 20 MG tablet Take 20 mg by mouth at bedtime.   Yes Historical Provider, MD  ondansetron (ZOFRAN-ODT) 4 MG disintegrating tablet Take 1 tablet by mouth every 6 (six) hours as needed. Nausea or vomiting 06/10/15  Yes Historical Provider, MD  phenazopyridine (PYRIDIUM) 200 MG tablet Take 1 tablet (200 mg total) by mouth 3 (three) times daily as needed for pain. 06/09/15  Yes Carolan Clines, MD  traMADol-acetaminophen (ULTRACET) 37.5-325 MG tablet Take 1 tablet by mouth every 6 (six) hours as needed for moderate pain. 06/09/15  Yes Carolan Clines, MD  trimethoprim (TRIMPEX) 100 MG tablet Take 1 tablet (100 mg total) by mouth daily. Take 1 tablet daily 06/09/15  Yes Carolan Clines, MD  docusate sodium (COLACE) 100 MG capsule Take 1 capsule (100 mg total) by mouth every 12 (twelve) hours. 06/14/15   Sharlett Iles, MD  polyethylene glycol powder (GLYCOLAX/MIRALAX) powder Take 1 capful (17g) mixed in drink one to three times daily as needed for soft stools  OTC 06/14/15   Sharlett Iles, MD   BP 173/69 mmHg  Pulse 75  Temp(Src) 97.4 F (  36.3 C)  Resp 18  SpO2 100% Physical Exam  Constitutional: He is oriented to person, place, and time. He appears well-developed and well-nourished. No distress.  HENT:  Head: Normocephalic and atraumatic.  Moist mucous membranes  Eyes: Conjunctivae are normal. Pupils are equal, round, and reactive to light.  Neck: Neck supple.  Cardiovascular: Normal rate, regular rhythm and normal heart sounds.   No murmur heard. Pulmonary/Chest: Effort normal and breath sounds  normal.  Abdominal: Soft. Bowel sounds are normal. He exhibits no distension. There is tenderness.  Firm, significantly distended bladder mildly tender to palpation  Genitourinary:  Mild swelling of penis w/ ecchymoses of penis and scrotum  Musculoskeletal: He exhibits no edema.  Neurological: He is alert and oriented to person, place, and time.  Fluent speech  Skin: Skin is warm and dry.  Ecchymoses involving suprapubic abdomen, penis, and scrotum  Psychiatric: He has a normal mood and affect. Judgment normal.  Nursing note and vitals reviewed.   ED Course  Procedures (including critical care time)  Limited Ultrasound of bladder  Performed by Dr. Rex Kras Indication: to assess for urinary retention and/or bladder volume prior to urinary catheter Technique:  Low frequency probe utilized in two planes to assess bladder volume in real-time. Findings: Bladder volume 10.4x 10.7x 15.1 = ~1119ml Additional findings: markedly distended bladder Images were archived electronically  Labs Review Labs Reviewed - No data to display  Medications  ondansetron (ZOFRAN-ODT) disintegrating tablet 4 mg (4 mg Oral Given 06/14/15 1000)  magnesium citrate solution 1 Bottle (1 Bottle Oral Given 06/14/15 1108)     MDM   Final diagnoses:  Urinary retention  Constipation, unspecified constipation type   Pt w/ recent [place] stay procedure, Foley catheter removed yesterday, presents with urinary retention since removal. He was uncomfortable but in no acute distress at presentation. Vital signs unremarkable. Significantly distended bladder on exam. Ultrasound shows at least 1000 MLS urine. Bedside nurse attempted Foley placement without success. Consulted Dr. Jeffie Pollock, urology, who evaluated pt in ED and placed foley at bedside. Pt w/ large volume of blood tinged clear urine output. Patient instructed to follow-up with Dr. Gaynelle Arabian in clinic. Regarding his constipation, I discussed supportive care and provided  with Colace and MiraLAX to use at home. Also gave the patient magnesium citrate here. Patient discharged in satisfactory condition.   Sharlett Iles, MD 06/14/15 1320

## 2015-06-14 NOTE — Discharge Instructions (Signed)
Retencin urinaria aguda, hombres (Acute Urinary Retention, Male) La retencin urinaria aguda es la incapacidad transitoria para Garment/textile technologist. Es un problema muy frecuente en los hombre de [place]. A medida que el hombre envejece, la prstata se agranda y bloquea el flujo de orina que proviene de la vejiga. Generalmente es un problema que surge gradualmente.  INSTRUCCIONES PARA EL CUIDADO EN EL HOGAR Si van a enviarlo a su casa con un catter Foley y un sistema de drenaje, necesitar resolver cul ser el mejor curso de accin junto con su mdico. Mientras el catter est colocado, mantenga una buena ingesta de lquidos. Mantenga la bolsa de drenaje vaca y en posicin ms baja que el catter. De este modo, la orina contaminada no fluir hacia atrs, hacia la vejiga, lo que podra causar una infeccin urinaria. Hay dos tipos principales de bolsa de [place]. Una es una bolsa grande que generalmente se utiliza por la noche. Tiene una buena capacidad, lo que permitir dormir toda la noche sin Merchant navy officer. El segundo tipo se llama bolsa de pierna. Tiene menos capacidad por lo tanto necesita vaciarse con ms frecuencia. Sin embargo, la ventaja principal es que puede adherirse con una correa a la pierna y puede ir debajo de la ropa, permitiendo la libertad para moverse o dejar su casa. Utilice los medicamentos de venta libre o recetados para Glass blower/designer, Health and safety inspector o la fiebre, segn se lo indique el mdico.  SOLICITE ATENCIN MDICA SI:  Tiene fiebre baja.  Siente espasmos o prdidas de orina con los espasmos. SOLICITE ATENCIN MDICA DE INMEDIATO SI:   Siente escalofros o le sube la fiebre.  El catter deja de drenar [place].  El catter se Therapist, occupational.  Aumenta el sangrado y no cesa con reposo ni al aumentar la ingesta de lquidos. ASEGRESE DE QUE:  Comprende estas instrucciones.  Controlar su afeccin.  Recibir ayuda de inmediato si no mejora o si empeora.   Esta  informacin no tiene Marine scientist el consejo del mdico. Asegrese de hacerle al mdico cualquier pregunta que tenga.   Document Released: 12/02/2004 Document Revised: 07/09/2014 Elsevier Interactive Patient Education 2016 [place] (Constipation, Adult) Estreimiento significa que una persona tiene menos de tres evacuaciones en una semana, dificultad para defecar, o que las heces son secas, duras, o ms grandes que lo normal. A medida que envejecemos el estreimiento es ms comn. Una dieta baja en fibra, no tomar suficientes lquidos y el uso de ciertos medicamentos pueden Agricultural engineer.  CAUSAS   Ciertos medicamentos, como los antidepresivos, analgsicos, suplementos de hierro, anticidos y diurticos.  Algunas enfermedades, como la diabetes, el sndrome del colon irritable, enfermedad de la tiroides, o depresin.  No beber suficiente agua.  No consumir suficientes alimentos ricos en fibra.  Situaciones de estrs o viajes.  Falta de actividad fsica o de ejercicio.  Ignorar la necesidad sbita de Landscape architect.  Uso en exceso de laxantes. SIGNOS Y SNTOMAS   Defecar menos de tres veces por semana.  Dificultad para defecar.  Tener las heces secas y duras, o ms grandes que las normales.  Sensacin de estar lleno o hinchado.  Dolor en la parte baja del abdomen.  No sentir alivio despus de defecar. DIAGNSTICO  El mdico le har una historia clnica y un examen fsico. Pueden hacerle exmenes adicionales para el estreimiento grave. Estos estudios pueden ser:  Un radiografa con enema de bario para examinar el recto, el colon y, en algunos casos,  el intestino delgado.  Una sigmoidoscopia para examinar el colon inferior.  Una colonoscopia para examinar todo el colon. TRATAMIENTO  El tratamiento depender de la gravedad del estreimiento y de la causa. Algunos tratamientos nutricionales son beber ms lquidos y comer ms  alimentos ricos en fibra. El cambio en el estilo de vida incluye hacer ejercicios de [place] regular. Si estas recomendaciones para Animator dieta y en el estilo de vida no ayudan, el mdico le puede indicar el uso de laxantes de venta libre para ayudarlo a Landscape architect. Los medicamentos recetados se pueden prescribir si los medicamentos de venta libre no lo [place].  INSTRUCCIONES PARA EL CUIDADO EN EL HOGAR   Consuma alimentos con alto contenido de [place], como frutas, vegetales, cereales integrales y porotos.  Limite los alimentos procesados ricos en grasas y azcar, como las papas fritas, hamburguesas, galletas, dulces y refrescos.  Puede agregar un suplemento de fibra a su dieta si no obtiene lo suficiente de los alimentos.  Beba suficiente lquido para Consulting civil engineer orina clara o de color amarillo plido.  Haga ejercicio regularmente o segn las indicaciones del mdico.  Vaya al bao cuando sienta la necesidad de ir. No se aguante las ganas.  Tome solo medicamentos de venta libre o recetados, segn las indicaciones del mdico. No tome otros medicamentos para el estreimiento sin consultarlo antes con su mdico. SOLICITE ATENCIN MDICA DE INMEDIATO SI:   Observa sangre brillante en las heces.  El estreimiento dura ms de 4 das o [place].  Siente dolor abdominal o rectal.  Las heces son delgadas como un lpiz.  Pierde peso de [place] inexplicable. ASEGRESE DE QUE:   Comprende estas instrucciones.  Controlar su afeccin.  Recibir ayuda de inmediato si no mejora o si empeora.   Esta informacin no tiene Marine scientist el consejo del mdico. Asegrese de hacerle al mdico cualquier pregunta que tenga.   Document Released: 03/14/2007 Document Revised: 03/15/2014 Elsevier Interactive Patient Education [employer].

## 2015-06-14 NOTE — ED Notes (Signed)
Unable to advance coude catheter, dr Rex Kras aware.

## 2015-06-14 NOTE — ED Notes (Signed)
Dr Jeffie Pollock, urology, at the bedside

## 2015-06-19 ENCOUNTER — Encounter (HOSPITAL_BASED_OUTPATIENT_CLINIC_OR_DEPARTMENT_OTHER): Payer: Self-pay | Admitting: Urology

## 2016-09-27 ENCOUNTER — Other Ambulatory Visit: Payer: Self-pay | Admitting: Urology

## 2016-09-27 DIAGNOSIS — C61 Malignant neoplasm of prostate: Secondary | ICD-10-CM

## 2016-10-14 ENCOUNTER — Encounter (HOSPITAL_COMMUNITY)
Admission: RE | Admit: 2016-10-14 | Discharge: 2016-10-14 | Disposition: A | Discharge status: Home / Self Care | Payer: Medicare Other | Source: Ambulatory Visit | Attending: Urology | Admitting: Urology

## 2016-10-14 DIAGNOSIS — C61 Malignant neoplasm of prostate: Secondary | ICD-10-CM | POA: Diagnosis not present

## 2016-10-14 MED ORDER — AXUMIN (FLUCICLOVINE F 18) INJECTION
10.9500 | Freq: Once | INTRAVENOUS | Status: AC
Start: 2016-10-14 — End: 2016-10-14
  Administered 2016-10-14: 10.95 via INTRAVENOUS

## 2016-11-15 ENCOUNTER — Encounter: Payer: Self-pay | Admitting: Radiation Oncology

## 2016-11-22 ENCOUNTER — Ambulatory Visit: Payer: Medicare Other | Admitting: Radiation Oncology

## 2016-11-22 ENCOUNTER — Ambulatory Visit: Payer: Medicare Other

## 2017-03-03 ENCOUNTER — Other Ambulatory Visit (HOSPITAL_COMMUNITY): Payer: Self-pay | Admitting: *Deleted

## 2017-03-04 ENCOUNTER — Ambulatory Visit (HOSPITAL_COMMUNITY)
Admission: RE | Admit: 2017-03-04 | Discharge: 2017-03-04 | Disposition: A | Discharge status: Home / Self Care | Payer: Medicare Other | Source: Ambulatory Visit | Attending: Nephrology | Admitting: Nephrology

## 2017-03-04 DIAGNOSIS — D631 Anemia in chronic kidney disease: Secondary | ICD-10-CM | POA: Insufficient documentation

## 2017-03-04 MED ORDER — SODIUM CHLORIDE 0.9 % IV SOLN
510.0000 mg | INTRAVENOUS | Status: DC
  Administered 2017-03-04: 510 mg via INTRAVENOUS
  Filled 2017-03-04: qty 17

## 2017-03-04 NOTE — Discharge Instructions (Signed)
Ferumoxytol injection Qu es este medicamento? El FERUMOXYTOL es un complejo de hierro. El hierro se utiliza para la produccin de glbulos rojos sanos, los cuales transportan el oxgeno y los nutrientes hacia todo el cuerpo. Este medicamento se utiliza para tratar la anemia por falta de hierro a las personas con enfermedad renal crnica. Este medicamento puede ser utilizado para otros usos; si tiene alguna pregunta consulte con su proveedor de atencin mdica o con su farmacutico. MARCAS COMUNES: Feraheme Qu le debo informar a mi profesional de la salud antes de tomar este medicamento? Necesita saber si usted presenta alguno de los siguientes problemas o situaciones: -anemia no provocada por niveles bajos de hierro -niveles altos de hierro en la sangre -examen de imgenes por resonancia magntica (IRM) programado -una reaccin alrgica o inusual al hierro, otros medicamentos, alimentos, colorantes o conservantes -si est embarazada o buscando quedar embarazada -si est amamantando a un beb Cmo debo utilizar este medicamento? Este medicamento se administra mediante inyeccin por va intravenosa. Lo administra un profesional de la salud en un hospital o en un entorno clnico. Hable con su pediatra para informarse acerca del uso de este medicamento en nios. Puede requerir atencin especial. Sobredosis: Pngase en contacto inmediatamente con un centro toxicolgico o una sala de urgencia si usted cree que haya tomado demasiado medicamento. ATENCIN: Este medicamento es solo para usted. No comparta este medicamento con nadie. Qu sucede si me olvido de una dosis? Es importante no olvidar ninguna dosis. Informe a su mdico o a su profesional de la salud si no puede asistir a una cita. Qu puede interactuar con este medicamento? Esta medicina puede interactuar con los siguientes medicamentos: -otros productos de hierro Puede ser que esta lista no menciona todas las posibles interacciones.  Informe a su profesional de la salud de todos los productos a base de hierbas, medicamentos de venta libre o suplementos nutritivos que est tomando. Si usted fuma, consume bebidas alcohlicas o si utiliza drogas ilegales, indqueselo tambin a su profesional de la salud. Algunas sustancias pueden interactuar con su medicamento. A qu debo estar atento al usar este medicamento? Visite a su mdico o a su profesional de la salud de manera regular. Si los sntomas no comienzan a mejorar o si empeoran, consulte con su mdico o con su profesional de la salud. Tal vez necesita realizarse anlisis de sangre mientras recibe este medicamento. Tal vez necesita seguir una dieta especial. Consulte a su mdico. Los alimentos que contienen hierro incluyen: alimentos integrales o con cereales, frutas secas, frijoles o arvejas, vegetales de hoja verde y carne que proviene de rganos (hgado, rin). Qu efectos secundarios puedo tener al utilizar este medicamento? Efectos secundarios que debe informar a su mdico o a su profesional de la salud tan pronto como sea posible: reacciones alrgicas, como erupcin cutnea, picazn o urticarias, e hinchazn de la cara, los labios o la lengua problemas respiratorios cambios en la presin sangunea sensacin de desmayos o aturdimiento, cadas fiebre o escalofros enrojecimiento, sudoracin o sensacin de calor hinchazn de tobillos o pies Efectos secundarios que generalmente no requieren atencin mdica (infrmelos a su mdico o a su profesional de la salud si persisten o si son molestos): diarrea dolor de cabeza nuseas, vmito dolor estomacal Puede ser que esta lista no menciona todos los posibles efectos secundarios. Comunquese a su mdico por asesoramiento mdico sobre los efectos secundarios. Usted puede informar los efectos secundarios a la FDA por telfono al 1-800-FDA-1088. Dnde debo guardar mi medicina? Este medicamento se administra en hospitales   o clnicas y no  necesitar guardarlo en su domicilio. ATENCIN: Este folleto es un resumen. Puede ser que no cubra toda la posible informacin. Si usted tiene preguntas acerca de esta medicina, consulte con su mdico, su farmacutico o su profesional de la salud.  2018 Elsevier/Gold Standard (2015-06-09 00:00:00)  

## 2017-03-11 ENCOUNTER — Ambulatory Visit (HOSPITAL_COMMUNITY)
Admission: RE | Admit: 2017-03-11 | Discharge: 2017-03-11 | Disposition: A | Discharge status: Home / Self Care | Payer: Medicare Other | Source: Ambulatory Visit | Attending: Nephrology | Admitting: Nephrology

## 2017-03-11 ENCOUNTER — Encounter (HOSPITAL_COMMUNITY): Payer: Self-pay

## 2017-03-11 DIAGNOSIS — D631 Anemia in chronic kidney disease: Secondary | ICD-10-CM | POA: Insufficient documentation

## 2017-03-11 DIAGNOSIS — N189 Chronic kidney disease, unspecified: Secondary | ICD-10-CM | POA: Diagnosis present

## 2017-03-11 MED ORDER — FERUMOXYTOL INJECTION 510 MG/17 ML
510.0000 mg | INTRAVENOUS | Status: DC
  Administered 2017-03-11: 510 mg via INTRAVENOUS
  Filled 2017-03-11: qty 17

## 2017-04-08 DIAGNOSIS — A0472 Enterocolitis due to Clostridium difficile, not specified as recurrent: Secondary | ICD-10-CM

## 2017-04-08 HISTORY — DX: Enterocolitis due to Clostridium difficile, not specified as recurrent: A04.72

## 2017-04-09 ENCOUNTER — Other Ambulatory Visit: Payer: Self-pay

## 2017-04-09 ENCOUNTER — Inpatient Hospital Stay (HOSPITAL_COMMUNITY)
Admission: EM | Admit: 2017-04-09 | Discharge: 2017-04-12 | DRG: 372 | Disposition: A | Discharge status: Skilled Nursing Facility | Payer: Medicare Other | Attending: Internal Medicine | Admitting: Internal Medicine

## 2017-04-09 ENCOUNTER — Emergency Department (HOSPITAL_COMMUNITY): Payer: Medicare Other

## 2017-04-09 ENCOUNTER — Encounter (HOSPITAL_COMMUNITY): Payer: Self-pay | Admitting: *Deleted

## 2017-04-09 DIAGNOSIS — Z79899 Other long term (current) drug therapy: Secondary | ICD-10-CM

## 2017-04-09 DIAGNOSIS — F17210 Nicotine dependence, cigarettes, uncomplicated: Secondary | ICD-10-CM | POA: Diagnosis present

## 2017-04-09 DIAGNOSIS — D631 Anemia in chronic kidney disease: Secondary | ICD-10-CM | POA: Diagnosis present

## 2017-04-09 DIAGNOSIS — N183 Chronic kidney disease, stage 3 (moderate): Secondary | ICD-10-CM | POA: Diagnosis present

## 2017-04-09 DIAGNOSIS — E860 Dehydration: Secondary | ICD-10-CM | POA: Diagnosis present

## 2017-04-09 DIAGNOSIS — N1330 Unspecified hydronephrosis: Secondary | ICD-10-CM | POA: Diagnosis present

## 2017-04-09 DIAGNOSIS — E785 Hyperlipidemia, unspecified: Secondary | ICD-10-CM | POA: Diagnosis present

## 2017-04-09 DIAGNOSIS — I129 Hypertensive chronic kidney disease with stage 1 through stage 4 chronic kidney disease, or unspecified chronic kidney disease: Secondary | ICD-10-CM | POA: Diagnosis present

## 2017-04-09 DIAGNOSIS — Z8546 Personal history of malignant neoplasm of prostate: Secondary | ICD-10-CM | POA: Diagnosis not present

## 2017-04-09 DIAGNOSIS — A0472 Enterocolitis due to Clostridium difficile, not specified as recurrent: Principal | ICD-10-CM | POA: Diagnosis present

## 2017-04-09 DIAGNOSIS — E876 Hypokalemia: Secondary | ICD-10-CM | POA: Diagnosis present

## 2017-04-09 DIAGNOSIS — K529 Noninfective gastroenteritis and colitis, unspecified: Secondary | ICD-10-CM | POA: Diagnosis not present

## 2017-04-09 DIAGNOSIS — R197 Diarrhea, unspecified: Secondary | ICD-10-CM | POA: Diagnosis not present

## 2017-04-09 DIAGNOSIS — E872 Acidosis: Secondary | ICD-10-CM | POA: Diagnosis present

## 2017-04-09 DIAGNOSIS — N179 Acute kidney failure, unspecified: Secondary | ICD-10-CM | POA: Diagnosis present

## 2017-04-09 LAB — CBC
HCT: 33.2 % — ABNORMAL LOW (ref 39.0–52.0)
Hemoglobin: 11 g/dL — ABNORMAL LOW (ref 13.0–17.0)
MCH: 29.3 pg (ref 26.0–34.0)
MCHC: 33.1 g/dL (ref 30.0–36.0)
MCV: 88.5 fL (ref 78.0–100.0)
PLATELETS: 190 10*3/uL (ref 150–400)
RBC: 3.75 MIL/uL — AB (ref 4.22–5.81)
RDW: 16 % — AB (ref 11.5–15.5)
WBC: 13.1 10*3/uL — AB (ref 4.0–10.5)

## 2017-04-09 LAB — COMPREHENSIVE METABOLIC PANEL
ALT: 15 U/L — AB (ref 17–63)
AST: 33 U/L (ref 15–41)
Albumin: 1.7 g/dL — ABNORMAL LOW (ref 3.5–5.0)
Alkaline Phosphatase: 64 U/L (ref 38–126)
Anion gap: 10 (ref 5–15)
BILIRUBIN TOTAL: 0.7 mg/dL (ref 0.3–1.2)
BUN: 65 mg/dL — AB (ref 6–20)
CALCIUM: 7.4 mg/dL — AB (ref 8.9–10.3)
CHLORIDE: 106 mmol/L (ref 101–111)
CO2: 20 mmol/L — ABNORMAL LOW (ref 22–32)
CREATININE: 4.66 mg/dL — AB (ref 0.61–1.24)
GFR, EST AFRICAN AMERICAN: 12 mL/min — AB (ref >60)
GFR, EST NON AFRICAN AMERICAN: 10 mL/min — AB (ref >60)
Glucose, Bld: 112 mg/dL — ABNORMAL HIGH (ref 65–99)
Potassium: 2.8 mmol/L — ABNORMAL LOW (ref 3.5–5.1)
Sodium: 136 mmol/L (ref 135–145)
TOTAL PROTEIN: 4.5 g/dL — AB (ref 6.5–8.1)

## 2017-04-09 LAB — URINALYSIS, ROUTINE W REFLEX MICROSCOPIC
Bilirubin Urine: NEGATIVE
GLUCOSE, UA: 150 mg/dL — AB
Ketones, ur: 5 mg/dL — AB
Leukocytes, UA: NEGATIVE
Nitrite: NEGATIVE
PH: 6 (ref 5.0–8.0)
Protein, ur: >=300 mg/dL — AB
Specific Gravity, Urine: 1.013 (ref 1.005–1.030)

## 2017-04-09 LAB — C DIFFICILE QUICK SCREEN W PCR REFLEX
C DIFFICILE (CDIFF) INTERP: DETECTED
C DIFFICILE (CDIFF) TOXIN: POSITIVE — AB
C DIFFICLE (CDIFF) ANTIGEN: POSITIVE — AB

## 2017-04-09 LAB — LIPASE, BLOOD: LIPASE: 19 U/L (ref 11–51)

## 2017-04-09 MED ORDER — FUROSEMIDE 40 MG PO TABS
80.0000 mg | ORAL_TABLET | ORAL | Status: DC
  Administered 2017-04-09 – 2017-04-12 (×9): 80 mg via ORAL
  Filled 2017-04-09 (×9): qty 2

## 2017-04-09 MED ORDER — IOPAMIDOL (ISOVUE-300) INJECTION 61%
INTRAVENOUS | Status: AC
  Filled 2017-04-09: qty 100

## 2017-04-09 MED ORDER — TAMSULOSIN HCL 0.4 MG PO CAPS
0.4000 mg | ORAL_CAPSULE | Freq: Every day | ORAL | Status: DC
  Administered 2017-04-10 – 2017-04-12 (×3): 0.4 mg via ORAL
  Filled 2017-04-09 (×3): qty 1

## 2017-04-09 MED ORDER — METRONIDAZOLE IN NACL 5-0.79 MG/ML-% IV SOLN
500.0000 mg | Freq: Three times a day (TID) | INTRAVENOUS | Status: DC
  Administered 2017-04-09 – 2017-04-10 (×3): 500 mg via INTRAVENOUS
  Filled 2017-04-09 (×5): qty 100

## 2017-04-09 MED ORDER — SODIUM CHLORIDE 0.9 % IV BOLUS (SEPSIS)
1000.0000 mL | Freq: Once | INTRAVENOUS | Status: AC
  Administered 2017-04-09: 1000 mL via INTRAVENOUS

## 2017-04-09 MED ORDER — TRIAMCINOLONE ACETONIDE 0.5 % EX CREA
1.0000 "application " | TOPICAL_CREAM | Freq: Two times a day (BID) | CUTANEOUS | Status: DC
  Administered 2017-04-09 – 2017-04-12 (×5): 1 via TOPICAL
  Filled 2017-04-09: qty 15

## 2017-04-09 MED ORDER — BOOST / RESOURCE BREEZE PO LIQD CUSTOM
1.0000 | Freq: Three times a day (TID) | ORAL | Status: DC
  Administered 2017-04-09 – 2017-04-12 (×9): 1 via ORAL

## 2017-04-09 MED ORDER — CALCIUM ACETATE (PHOS BINDER) 667 MG PO CAPS
667.0000 mg | ORAL_CAPSULE | Freq: Three times a day (TID) | ORAL | Status: DC
  Administered 2017-04-09 – 2017-04-12 (×9): 667 mg via ORAL
  Filled 2017-04-09 (×10): qty 1

## 2017-04-09 MED ORDER — SODIUM CHLORIDE 0.9 % IV SOLN
INTRAVENOUS | Status: DC
  Administered 2017-04-09 – 2017-04-12 (×4): via INTRAVENOUS

## 2017-04-09 MED ORDER — FINASTERIDE 5 MG PO TABS
5.0000 mg | ORAL_TABLET | Freq: Every day | ORAL | Status: DC
  Administered 2017-04-10 – 2017-04-12 (×3): 5 mg via ORAL
  Filled 2017-04-09 (×3): qty 1

## 2017-04-09 MED ORDER — HEPARIN SODIUM (PORCINE) 5000 UNIT/ML IJ SOLN
5000.0000 [IU] | Freq: Three times a day (TID) | INTRAMUSCULAR | Status: DC
  Administered 2017-04-09 – 2017-04-11 (×7): 5000 [IU] via SUBCUTANEOUS
  Filled 2017-04-09 (×7): qty 1

## 2017-04-09 MED ORDER — POTASSIUM CHLORIDE 20 MEQ/15ML (10%) PO SOLN
20.0000 meq | Freq: Two times a day (BID) | ORAL | Status: DC
  Administered 2017-04-09 – 2017-04-12 (×6): 20 meq via ORAL
  Filled 2017-04-09 (×6): qty 15

## 2017-04-09 MED ORDER — FERROUS SULFATE 325 (65 FE) MG PO TABS
325.0000 mg | ORAL_TABLET | Freq: Two times a day (BID) | ORAL | Status: DC
  Administered 2017-04-09 – 2017-04-12 (×6): 325 mg via ORAL
  Filled 2017-04-09 (×6): qty 1

## 2017-04-09 MED ORDER — DEXTROSE 5 % IV SOLN
2.0000 g | Freq: Every evening | INTRAVENOUS | Status: DC
  Filled 2017-04-09: qty 2

## 2017-04-09 MED ORDER — SODIUM BICARBONATE 650 MG PO TABS
1300.0000 mg | ORAL_TABLET | Freq: Two times a day (BID) | ORAL | Status: DC
  Administered 2017-04-09 – 2017-04-12 (×6): 1300 mg via ORAL
  Filled 2017-04-09 (×6): qty 2

## 2017-04-09 MED ORDER — PRAVASTATIN SODIUM 10 MG PO TABS
10.0000 mg | ORAL_TABLET | Freq: Every day | ORAL | Status: DC
  Administered 2017-04-09 – 2017-04-11 (×3): 10 mg via ORAL
  Filled 2017-04-09 (×4): qty 1

## 2017-04-09 MED ORDER — POTASSIUM CHLORIDE CRYS ER 20 MEQ PO TBCR
40.0000 meq | EXTENDED_RELEASE_TABLET | Freq: Once | ORAL | Status: AC
  Administered 2017-04-09: 40 meq via ORAL
  Filled 2017-04-09: qty 2

## 2017-04-09 NOTE — ED Provider Notes (Signed)
[facility] Provider Note   CSN: [id number] Arrival date & time: 04/09/17  0932     History   Chief Complaint Chief Complaint  Patient presents with  . Fall  . Diarrhea  . Dehydration    HPI Marcus Lane is a 84 y.o. male.  HPI   84yo male with history of prostate cancer, hypertension, presents with concern for fall, generalized weakness, diarrhea over the last week.  Reports for 10 days, he has had diarrhea approximately 4 times a day.  Denies black or bloody stools.  Reports he vomited one time on Wednesday, but otherwise has not had any nausea or vomiting.  Reports some lower abdominal pain associated with symptoms.  Reports that he began to feel very weak last night, and as he was trying to walk to the bathroom and get back into the bed he was unable to lift himself into the bed, and fell slowly to the floor due to his generalized weakness.  Denies any injuries from the fall, including no headache, no loss of consciousness, no neck pain, no numbness or focal weakness.  Denies chest pain, shortness of breath.  Reports he has to self cath due to his history of prostate cancer.  No known sick contacts. No cough. Reports thinks he had abx about one month ago for UTI.  Past Medical History:  Diagnosis Date  . Cancer [facility]) 2017   prostate  . Hypertension     Patient Active Problem List   Diagnosis Date Noted  . Colitis 04/09/2017    Past Surgical History:  Procedure Laterality Date  . CRYOABLATION N/A 06/09/2015   Procedure: CRYO ABLATION PROSTATE;  Surgeon: Carolan Clines, MD;  Location: [facility];  Service: Urology;  Laterality: N/A;  . orif arm Right 1963   accident w/surgical repair-rt arm       Home Medications    Prior to Admission medications   Medication Sig Start Date End Date Taking? Authorizing Provider  calcium acetate (PHOSLO) 667 MG capsule Take by mouth 3 (three) times daily. 03/15/17  Yes [provider]  diphenoxylate-atropine (LOMOTIL) 2.5-0.025 MG tablet Take 1 tablet by mouth every 6 (six) hours as needed for diarrhea or loose stools. 04/01/17  Yes [provider]  FEROSUL 325 (65 Fe) MG tablet Take 325 mg by mouth 2 (two) times daily. 03/23/17  Yes [provider]  finasteride (PROSCAR) 5 MG tablet Take 5 mg by mouth daily. 03/15/17  Yes [provider]  furosemide (LASIX) 80 MG tablet Take 80 mg by mouth 3 (three) times daily. 03/15/17  Yes [provider]  lovastatin (MEVACOR) 20 MG tablet Take 20 mg by mouth at bedtime.   Yes [provider]  potassium chloride 20 MEQ/15ML (10%) SOLN Take 20 mEq by mouth 2 (two) times daily. 04/01/17  Yes [provider]  sodium bicarbonate 650 MG tablet Take 2 tablets by mouth 2 (two) times daily. 03/18/17  Yes [provider]  tamsulosin (FLOMAX) 0.4 MG CAPS capsule Take 0.4 mg by mouth daily. 03/15/17  Yes [provider]  triamcinolone cream (KENALOG) 0.5 % Apply 1 application topically 2 (two) times daily.  03/25/17  Yes [provider]  docusate sodium (COLACE) 100 MG capsule Take 1 capsule (100 mg total) by mouth every 12 (twelve) hours. Patient not taking: Reported on 04/09/2017 06/14/15   Little, Wenda Overland, MD  phenazopyridine (PYRIDIUM) 200 MG tablet Take 1 tablet (200 mg total) by  mouth 3 (three) times daily as needed for pain. Patient not taking: Reported on 04/09/2017 06/09/15   Carolan Clines, MD  polyethylene glycol powder [facility]) powder Take 1 capful (17g) mixed in drink one to three times daily as needed for soft stools  OTC Patient not taking: Reported on 04/09/2017 06/14/15   Little, Wenda Overland, MD  traMADol-acetaminophen (ULTRACET) 37.5-325 MG tablet Take 1 tablet by mouth every 6 (six) hours as needed for moderate pain. Patient not taking: Reported on 04/09/2017 06/09/15   Carolan Clines, MD  trimethoprim (TRIMPEX) 100 MG tablet Take 1  tablet (100 mg total) by mouth daily. Take 1 tablet daily Patient not taking: Reported on 04/09/2017 06/09/15   Carolan Clines, MD    Family History No family history on file.  Social History Social History   Tobacco Use  . Smoking status: Light Tobacco Smoker    Packs/day: 0.25  . Smokeless tobacco: Never Used  Substance Use Topics  . Alcohol use: Yes    Alcohol/week: 4.8 oz    Types: 8 Cans of beer per week  . Drug use: No     Allergies   Patient has no known allergies.   Review of Systems Review of Systems  Constitutional: Positive for appetite change, fatigue and fever (yesterday felt warm).  HENT: Negative for sore throat.   Eyes: Negative for visual disturbance.  Respiratory: Negative for shortness of breath.   Cardiovascular: Negative for chest pain.  Gastrointestinal: Positive for abdominal pain, diarrhea, nausea and vomiting.  Genitourinary: Positive for difficulty urinating (chronic, self cath).  Musculoskeletal: Negative for back pain and neck stiffness.  Skin: Negative for rash.  Neurological: Negative for syncope and headaches.     Physical Exam Updated Vital Signs BP 109/61 (BP Location: Right Arm)   Pulse 69   Temp 98.7 F (37.1 C) (Oral)   Resp (!) 21   SpO2 99%   Physical Exam  Constitutional: He is oriented to person, place, and time. He appears well-developed and well-nourished. No distress.  HENT:  Head: Normocephalic and atraumatic.  Eyes: Conjunctivae and EOM are normal.  Neck: Normal range of motion.  Cardiovascular: Normal rate, regular rhythm, normal heart sounds and intact distal pulses. Exam reveals no gallop and no friction rub.  No murmur heard. Pulmonary/Chest: Effort normal and breath sounds normal. No respiratory distress. He has no wheezes. He has no rales.  Abdominal: Soft. He exhibits no distension. There is tenderness (LLQ). There is guarding (LLQ).  Musculoskeletal: He exhibits no edema.  Neurological: He is alert and  oriented to person, place, and time.  Skin: Skin is warm and dry. He is not diaphoretic.  Nursing note and vitals reviewed.    ED Treatments / Results  Labs (all labs ordered are listed, but only abnormal results are displayed) Labs Reviewed  C DIFFICILE QUICK SCREEN W PCR REFLEX - Abnormal; Notable for the following components:      Result Value   C Diff antigen POSITIVE (*)    C Diff toxin POSITIVE (*)    All other components within normal limits  COMPREHENSIVE METABOLIC PANEL - Abnormal; Notable for the following components:   Potassium 2.8 (*)    CO2 20 (*)    Glucose, Bld 112 (*)    BUN 65 (*)    Creatinine, Ser 4.66 (*)    Calcium 7.4 (*)    Total Protein 4.5 (*)    Albumin 1.7 (*)    ALT 15 (*)    GFR calc  non Af Amer 10 (*)    GFR calc Af Amer 12 (*)    All other components within normal limits  CBC - Abnormal; Notable for the following components:   WBC 13.1 (*)    RBC 3.75 (*)    Hemoglobin 11.0 (*)    HCT 33.2 (*)    RDW 16.0 (*)    All other components within normal limits  GASTROINTESTINAL PANEL BY PCR, STOOL (REPLACES STOOL CULTURE)  LIPASE, BLOOD  URINALYSIS, ROUTINE W REFLEX MICROSCOPIC  URINALYSIS, ROUTINE W REFLEX MICROSCOPIC  BASIC METABOLIC PANEL    EKG  EKG Interpretation  Date/Time:  Saturday April 09 2017 12:04:23 EST Ventricular Rate:  66 PR Interval:    QRS Duration: 87 QT Interval:  469 QTC Calculation: 492 R Axis:   -7 Text Interpretation:  Sinus rhythm Multiform ventricular premature complexes Abnormal R-wave progression, early transition Borderline repolarization abnormality Borderline prolonged QT interval Nonspecific ST changes Confirmed by Gareth Morgan [phone]) on 04/09/2017 2:38:17 PM       Radiology Ct Abdomen Pelvis Wo Contrast  Result Date: 04/09/2017 CLINICAL DATA:  Diverticulitis EXAM: CT ABDOMEN AND PELVIS WITHOUT CONTRAST TECHNIQUE: Multidetector CT imaging of the abdomen and pelvis was performed following the  standard protocol without IV contrast. COMPARISON:  PET-CT 10/14/2016 FINDINGS: Lower chest: Atheromatous aortic calcifications. No pleural or pericardial effusion. Subpleural scarring or atelectasis posteriorly in the visualized lower lobes. Hepatobiliary: No focal liver abnormality is seen. No gallstones, gallbladder wall thickening, or biliary dilatation. Pancreas: Diffuse atrophy without ductal dilatation or mass. Spleen: Normal in size without focal abnormality. Adrenals/Urinary Tract: Normal adrenals. Mild hydronephrosis, increased on the right since previous exam. New right ureterectasis. Urinary bladder is physiologically distended. Stomach/Bowel: Stomach is nondistended. Small bowel is nondilated. There is a loop of distal ileum in left inguinal hernia without suggestion of obstruction or strangulation. Normal appendix. The colon is nondilated with suggestion of wall thickening in the proximal transverse segment and more conspicuously circumferentially in the distal descending and sigmoid segments, with adjacent mild inflammatory/edematous changes. No significant diverticular disease. Vascular/Lymphatic: Extensive aortic atheromatous plaque involving visceral and iliac arteries as well, without aneurysm. Ectatic infrarenal segment with stable partially calcified eccentric mural thrombus. Reproductive: Marked prostatic enlargement. Subcentimeter left para-aortic lymph nodes. No pelvic adenopathy. Other: No ascites.  No free air. Musculoskeletal: Left inguinal hernia containing a loop of small bowel without evidence of obstruction or strangulation. Spondylitic changes in the lower lumbar spine. No fracture or worrisome bone lesion. IMPRESSION: 1. Nonspecific colitis involving transverse, descending and sigmoid segments. 2. Progressive hydronephrosis right greater than left, and right ureterectasis since previous PET-CT of 10/14/2016. 3. Left inguinal hernia involving a loop of ileum, without obstruction or  strangulation. 4. Aortic Atherosclerosis (ICD10-170.0). Ectatic abdominal aorta at risk for aneurysm development. Recommend followup by ultrasound in 5 years. This recommendation follows ACR consensus guidelines: White Paper of the ACR Incidental Findings Committee II on Vascular Findings. J Am Coll Radiol 2013; 10:789-794. Electronically Signed   By: Lucrezia Europe M.D.   On: 04/09/2017 11:35    Procedures Procedures (including critical care time)  Medications Ordered in ED Medications  iopamidol (ISOVUE-300) 61 % injection (  Canceled Entry 04/09/17 1121)  calcium acetate (PHOSLO) capsule 667 mg (not administered)  ferrous sulfate tablet 325 mg (not administered)  finasteride (PROSCAR) tablet 5 mg (not administered)  furosemide (LASIX) tablet 80 mg (not administered)  pravastatin (PRAVACHOL) tablet 10 mg (not administered)  potassium chloride 20 MEQ/15ML (10%) solution 20 mEq (not  administered)  sodium bicarbonate tablet 1,300 mg (not administered)  tamsulosin (FLOMAX) capsule 0.4 mg (not administered)  triamcinolone cream (KENALOG) 0.5 % 1 application (not administered)  heparin injection 5,000 Units (not administered)  metroNIDAZOLE (FLAGYL) IVPB 500 mg (not administered)  0.9 %  sodium chloride infusion (not administered)  cefTRIAXone (ROCEPHIN) 2 g in dextrose 5 % 50 mL IVPB (not administered)  sodium chloride 0.9 % bolus 1,000 mL (0 mLs Intravenous Stopped 04/09/17 1207)  potassium chloride SA (K-DUR,KLOR-CON) CR tablet 40 mEq (40 mEq Oral Given 04/09/17 1558)     Initial Impression / Assessment and Plan / ED Course  I have reviewed the triage vital signs and the nursing notes.  Pertinent labs & imaging results that were available during my care of the patient were reviewed by me and considered in my medical decision making (see chart for details).     84yo male with history of prostate cancer, hypertension, presents with concern for fall, generalized weakness, diarrhea over the last  week.  Patient denies any injuries from the fall, has no signs of head trauma, no neck pain, no numbness or focal weakness and have low suspicion for traumatic injuries from fall.  Patient's generalized weakness is likely secondary to dehydration in the setting of diarrhea. He has focal left lower quadrant tenderness on exam, and CT abdomen pelvis was ordered to evaluate for diverticulitis and shows nonspecific colitis involving the transverse, descending and sigmoid segments.  It also shows progressive hydronephrosis on the right greater than left in comparison to PET/CT of August 2018.  There is a left inguinal hernia without obstruction or strangulation.   Reviewed outpatient labs done through Nephrology office (pt sees Dr. Jimmy Footman), spoke with Dr. Lorrene Reid.  Recent outpatient labs show BUN 50 Cr 4.01, k 4.4 bicarb 16. Dec BUN 48 Cr3.63.  Increased sodium bicarb at Nephrology visit 1/17. Discussed dialysis to which patient states "I would rather die than start dialysis."  He states the same thing today when I ask him.  Labs today show worsening of CKD with Cr to 4.66, with bicarb of 20 (improved from prior), potassium of 2.8, albumin 1.7 and calcium 7.4 (normal corrected.)  Worsening creatinine may be secondary to progression of patient's renal disease, dehydration related to diarrhea, or worsening obstruction related to prostate cancer.    Gave patient IV fluids in the emergency department, 22meq po potassium.  Patient lives alone with severe generalized weakness and is unable to take care of himself at this time due to dehydration in setting of diarrhea.  We will plan to admit to the hospital for continued hydration.  Stool studies including C. difficile ordered and pending.  Will await results prior to initiating any antibiotics.  Suspect colitis seen on CT scan is likely infectious in nature.  Final Clinical Impressions(s) / ED Diagnoses   Final diagnoses:  Dehydration  Colitis  Diarrhea of  presumed infectious origin    ED Discharge Orders    None       Gareth Morgan, MD 04/09/17 1711

## 2017-04-09 NOTE — ED Notes (Signed)
Bed: YK99 Expected date:  Expected time:  Means of arrival:  Comments: fall

## 2017-04-09 NOTE — H&P (Signed)
[facility] History and Physical  Marcus Lane OVZ:[id number] DOB: 02/17/1932 DOA: 04/09/2017  Referring physician:  PCP: System, Pcp Not In  Specialists:   Chief Complaint: diarrhea   HPI: Marcus Lane is a 83 y.o. male with PMH of HTN, Prostate CA, CKD (refused HD) presented with progressive generalized weakness and fatigue. He reports having non resolving intermittent diarrhea for 4-5 days.  He states that he vomited once. No hematemesis no hematochezia. He reports mild left sided abdominal pains. No fevers. No acute chest pains or shortness of breath. He has chronic mild leg edema. CT scan of abdomen in ED showed Nonspecific colitis involving transverse, descending and sigmoid segments. Patient lives alone, looks chronically ill. No acute distress.   Review of Systems: The patient denies anorexia, fever, weight loss,, vision loss, decreased hearing, hoarseness, chest pain, syncope, dyspnea on exertion, peripheral edema, balance deficits, hemoptysis, abdominal pain, melena, hematochezia, severe indigestion/heartburn, hematuria, incontinence, genital sores, muscle weakness, suspicious skin lesions, transient blindness, difficulty walking, depression, unusual weight change, abnormal bleeding, enlarged lymph nodes, angioedema, and breast masses.    Past Medical History:  Diagnosis Date  . Cancer [facility]) 2017   prostate  . Hypertension    Past Surgical History:  Procedure Laterality Date  . CRYOABLATION N/A 06/09/2015   Procedure: CRYO ABLATION PROSTATE;  Surgeon: Carolan Clines, MD;  Location: [facility];  Service: Urology;  Laterality: N/A;  . orif arm Right 1963   accident w/surgical repair-rt arm   Social History:  reports that he has been smoking.  He has been smoking about 0.25 packs per day. he has never used smokeless tobacco. He reports that he drinks about 4.8 oz of alcohol per week. He reports that he does not use drugs. Home;  where does patient live--home,  ALF, SNF? and with whom if at home? Yes;  Can patient participate in ADLs?  No Known Allergies  No family history on file.  (be sure to complete)  Prior to Admission medications   Medication Sig Start Date End Date Taking? Authorizing Provider  calcium acetate (PHOSLO) 667 MG capsule Take by mouth 3 (three) times daily. 03/15/17  Yes [provider]  diphenoxylate-atropine (LOMOTIL) 2.5-0.025 MG tablet Take 1 tablet by mouth every 6 (six) hours as needed for diarrhea or loose stools. 04/01/17  Yes [provider]  FEROSUL 325 (65 Fe) MG tablet Take 325 mg by mouth 2 (two) times daily. 03/23/17  Yes [provider]  finasteride (PROSCAR) 5 MG tablet Take 5 mg by mouth daily. 03/15/17  Yes [provider]  furosemide (LASIX) 80 MG tablet Take 80 mg by mouth 3 (three) times daily. 03/15/17  Yes [provider]  lovastatin (MEVACOR) 20 MG tablet Take 20 mg by mouth at bedtime.   Yes [provider]  potassium chloride 20 MEQ/15ML (10%) SOLN Take 20 mEq by mouth 2 (two) times daily. 04/01/17  Yes [provider]  sodium bicarbonate 650 MG tablet Take 2 tablets by mouth 2 (two) times daily. 03/18/17  Yes [provider]  tamsulosin (FLOMAX) 0.4 MG CAPS capsule Take 0.4 mg by mouth daily. 03/15/17  Yes [provider]  triamcinolone cream (KENALOG) 0.5 % Apply 1 application topically 2 (two) times daily.  03/25/17  Yes [provider]  docusate sodium (COLACE) 100 MG capsule Take 1 capsule (100 mg total) by mouth every 12 (twelve) hours. Patient not taking: Reported on 04/09/2017 06/14/15   Little, Wenda Overland, MD  phenazopyridine (PYRIDIUM) 200  MG tablet Take 1 tablet (200 mg total) by mouth 3 (three) times daily as needed for pain. Patient not taking: Reported on 04/09/2017 06/09/15   Carolan Clines, MD  polyethylene glycol powder [facility]) powder Take 1 capful (17g) mixed in drink one to three times daily as  needed for soft stools  OTC Patient not taking: Reported on 04/09/2017 06/14/15   Little, Wenda Overland, MD  traMADol-acetaminophen (ULTRACET) 37.5-325 MG tablet Take 1 tablet by mouth every 6 (six) hours as needed for moderate pain. Patient not taking: Reported on 04/09/2017 06/09/15   Carolan Clines, MD  trimethoprim (TRIMPEX) 100 MG tablet Take 1 tablet (100 mg total) by mouth daily. Take 1 tablet daily Patient not taking: Reported on 04/09/2017 06/09/15   Carolan Clines, MD   Physical Exam: Vitals:   04/09/17 1300 04/09/17 1400  BP: (!) 106/54 127/82  Pulse: 60 73  Resp: (!) 23 (!) 27  Temp:    SpO2: 94% 94%     General:  Alert. No distress   Eyes: eom-I, perrla   ENT: no oral ulcers   Neck: supple.   Cardiovascular: s1,s2 rrr  Respiratory: few crackles LL  Abdomen: soft, mild left sided lower quadrant tenderness, no rebound; + BS   Skin: few ecchymosis   Musculoskeletal: pedal edema   Psychiatric: no hallucinations   Neurologic: CN 2-12 intact. Motor 5/5 BL symmetric   Labs on Admission:  Basic Metabolic Panel: Recent Labs  Lab 04/09/17 1002  NA 136  K 2.8*  CL 106  CO2 20*  GLUCOSE 112*  BUN 65*  CREATININE 4.66*  CALCIUM 7.4*   Liver Function Tests: Recent Labs  Lab 04/09/17 1002  AST 33  ALT 15*  ALKPHOS 64  BILITOT 0.7  PROT 4.5*  ALBUMIN 1.7*   Recent Labs  Lab 04/09/17 1002  LIPASE 19   No results for input(s): AMMONIA in the last 168 hours. CBC: Recent Labs  Lab 04/09/17 1002  WBC 13.1*  HGB 11.0*  HCT 33.2*  MCV 88.5  PLT 190   Cardiac Enzymes: No results for input(s): CKTOTAL, CKMB, CKMBINDEX, TROPONINI in the last 168 hours.  BNP (last 3 results) No results for input(s): BNP in the last 8760 hours.  ProBNP (last 3 results) No results for input(s): PROBNP in the last 8760 hours.  CBG: No results for input(s): GLUCAP in the last 168 hours.  Radiological Exams on Admission: Ct Abdomen Pelvis Wo  Contrast  Result Date: 04/09/2017 CLINICAL DATA:  Diverticulitis EXAM: CT ABDOMEN AND PELVIS WITHOUT CONTRAST TECHNIQUE: Multidetector CT imaging of the abdomen and pelvis was performed following the standard protocol without IV contrast. COMPARISON:  PET-CT 10/14/2016 FINDINGS: Lower chest: Atheromatous aortic calcifications. No pleural or pericardial effusion. Subpleural scarring or atelectasis posteriorly in the visualized lower lobes. Hepatobiliary: No focal liver abnormality is seen. No gallstones, gallbladder wall thickening, or biliary dilatation. Pancreas: Diffuse atrophy without ductal dilatation or mass. Spleen: Normal in size without focal abnormality. Adrenals/Urinary Tract: Normal adrenals. Mild hydronephrosis, increased on the right since previous exam. New right ureterectasis. Urinary bladder is physiologically distended. Stomach/Bowel: Stomach is nondistended. Small bowel is nondilated. There is a loop of distal ileum in left inguinal hernia without suggestion of obstruction or strangulation. Normal appendix. The colon is nondilated with suggestion of wall thickening in the proximal transverse segment and more conspicuously circumferentially in the distal descending and sigmoid segments, with adjacent mild inflammatory/edematous changes. No significant diverticular disease. Vascular/Lymphatic: Extensive aortic atheromatous plaque involving visceral and iliac  arteries as well, without aneurysm. Ectatic infrarenal segment with stable partially calcified eccentric mural thrombus. Reproductive: Marked prostatic enlargement. Subcentimeter left para-aortic lymph nodes. No pelvic adenopathy. Other: No ascites.  No free air. Musculoskeletal: Left inguinal hernia containing a loop of small bowel without evidence of obstruction or strangulation. Spondylitic changes in the lower lumbar spine. No fracture or worrisome bone lesion. IMPRESSION: 1. Nonspecific colitis involving transverse, descending and sigmoid  segments. 2. Progressive hydronephrosis right greater than left, and right ureterectasis since previous PET-CT of 10/14/2016. 3. Left inguinal hernia involving a loop of ileum, without obstruction or strangulation. 4. Aortic Atherosclerosis (ICD10-170.0). Ectatic abdominal aorta at risk for aneurysm development. Recommend followup by ultrasound in 5 years. This recommendation follows ACR consensus guidelines: White Paper of the ACR Incidental Findings Committee II on Vascular Findings. J Am Coll Radiol 2013; 10:789-794. Electronically Signed   By: Lucrezia Europe M.D.   On: 04/09/2017 11:35    EKG: Independently reviewed.   Assessment/Plan Active Problems:   Colitis   84 y.o. male with PMH of HTN, Prostate CA, CKD (refused HD) presented with progressive generalized weakness and fatigue and found to have colitis.    Colitis, diarrhea, abd pain. Suspect infectious colitis. No hematochezia. Ct abd: Nonspecific colitis involving transverse, descending and sigmoid segments. No recent antibiotic use.  -will start empiric iv antibiotics, diet clears advance as tolerated. Gentle iv fluids. Will f/u r/o c diff, gi panel. Recommended to f/u with colonoscopy as outpatient in 6-8 weeks   Mild AKI on CKD. Recent creatinine 4.01. BUN -50 bicarb -16 in December. He reports that he still makes urine. Unclear if oliguric. Will hydrate gentle while on clear diet, obtain bladder ultrasound r/o obstruction due to hydronephrosis  -d/w, he declined dialysis if indicated   Hypokalemia due to gi loss/diarrhea. Will replace gentle avoid overcorrection due to CKD. monitor   Generalized weakness, frailty. ongoing chronic illness. Lives alone. Will obtain pt/ot eval   None;  if consultant consulted, please document name and whether formally or informally consulted  Code Status: full (must indicate code status--if unknown or must be presumed, indicate so) Family Communication: d/w patient, ED/MD (indicate person spoken  with, if applicable, with phone number if by telephone) Disposition Plan: pend pt eval (indicate anticipated LOS)  Time spent: >45 minutes   Kinnie Feil [facility] Pager [phone]  If 7PM-7AM, please contact night-coverage [email or web address] Password [employer] 04/09/2017, 3:37 PM

## 2017-04-09 NOTE — ED Notes (Addendum)
Urine and stool in bed pan, unable to collect due to contamination. Small sample of stool sent to lab, lab states they are going to attempt to run c.diff test.

## 2017-04-09 NOTE — ED Triage Notes (Signed)
EMs states pt slid off bed at 0200 while getting up to go to bathroom, pt has had diarrhea for 8 days, no vomiting, pt is a cancer pt, pt alert and oriented and recalls all events. CBG 121 148/84-110-18-98% RA

## 2017-04-09 NOTE — ED Notes (Signed)
Patient transported to CT 

## 2017-04-09 NOTE — ED Notes (Signed)
Will need to recollect stool sample, per lab. Delay in results.

## 2017-04-09 NOTE — ED Notes (Addendum)
Pt's heart rate dropped down to 43, EKG was obtained but HR was back up to 60-70. Pt resting comfortably a this time. HR showing a-fib- EKG given to MD Schlossman and she is aware.

## 2017-04-09 NOTE — ED Notes (Signed)
Pt placed on bedpan

## 2017-04-09 NOTE — ED Notes (Signed)
ED TO INPATIENT HANDOFF REPORT  Name/Age/Gender Marcus Lane 84 y.o. male  Code Status    Code Status Orders  (From admission, onward)        Start     Ordered   04/09/17 1601  Full code  Continuous     04/09/17 1605    Code Status History    Date Active Date Inactive Code Status Order ID Comments User Context   This patient has a current code status but no historical code status.      Home/SNF/Other Home  Chief Complaint dehydration  Level of Care/Admitting Diagnosis ED Disposition    ED Disposition Condition [facility] Area: [facility] [[id number]]  Level of Care: Med-Surg [16]  Diagnosis: Colitis [[id number]]  Admitting Physician: Kinnie Feil [[id number]]  Attending Physician: Kinnie Feil [[id number]]  Estimated length of stay: past midnight tomorrow  Certification:: I certify this patient will need inpatient services for at least 2 midnights  PT Class (Do Not Modify): Inpatient [101]  PT Acc Code (Do Not Modify): Private [1]       Medical History Past Medical History:  Diagnosis Date  . Cancer [facility]) 2017   prostate  . Hypertension     Allergies No Known Allergies  IV Location/Drains/Wounds Patient Lines/Drains/Airways Status   Active Line/Drains/Airways    Name:   Placement date:   Placement time:   Site:   Days:   Peripheral IV 04/09/17 Right Wrist   04/09/17    1000    Wrist   less than 1   Urethral Catheter Dr Jeffie Pollock  Coude 18 Fr.   06/14/15    1103    Coude   665   Incision (Closed) 06/09/15 Penis Other (Comment)   06/09/15    1028     670   Incision (Closed) 06/09/15 Perineum Other (Comment)   06/09/15    1244     670          Labs/Imaging Results for orders placed or performed during the hospital encounter of 04/09/17 (from the past 48 hour(s))  Lipase, blood     Status: None   Collection Time: 04/09/17 10:02 AM  Result Value Ref Range   Lipase 19 11 - 51 U/L    Comment: Performed at [facility], [place] [address] [place], [place] [address]  Comprehensive metabolic panel     Status: Abnormal   Collection Time: 04/09/17 10:02 AM  Result Value Ref Range   Sodium 136 135 - 145 mmol/L   Potassium 2.8 (L) 3.5 - 5.1 mmol/L   Chloride 106 101 - 111 mmol/L   CO2 20 (L) 22 - 32 mmol/L   Glucose, Bld 112 (H) 65 - 99 mg/dL   BUN 65 (H) 6 - 20 mg/dL   Creatinine, Ser 4.66 (H) 0.61 - 1.24 mg/dL   Calcium 7.4 (L) 8.9 - 10.3 mg/dL   Total Protein 4.5 (L) 6.5 - 8.1 g/dL   Albumin 1.7 (L) 3.5 - 5.0 g/dL   AST 33 15 - 41 U/L   ALT 15 (L) 17 - 63 U/L   Alkaline Phosphatase 64 38 - 126 U/L   Total Bilirubin 0.7 0.3 - 1.2 mg/dL   GFR calc non Af Amer 10 (L) >60 mL/min   GFR calc Af Amer 12 (L) >60 mL/min    Comment: (NOTE) The eGFR has been calculated using the CKD EPI equation. This calculation has not been validated in  all clinical situations. eGFR's persistently <60 mL/min signify possible Chronic Kidney Disease.    Anion gap 10 5 - 15    Comment: Performed at [facility], [place] [address], [place], [place] [address]  CBC     Status: Abnormal   Collection Time: 04/09/17 10:02 AM  Result Value Ref Range   WBC 13.1 (H) 4.0 - 10.5 K/uL   RBC 3.75 (L) 4.22 - 5.81 MIL/uL   Hemoglobin 11.0 (L) 13.0 - 17.0 g/dL   HCT 33.2 (L) 39.0 - 52.0 %   MCV 88.5 78.0 - 100.0 fL   MCH 29.3 26.0 - 34.0 pg   MCHC 33.1 30.0 - 36.0 g/dL   RDW 16.0 (H) 11.5 - 15.5 %   Platelets 190 150 - 400 K/uL    Comment: Performed at [facility], [place] [address] [place], [place] [address]   Ct Abdomen Pelvis Wo Contrast  Result Date: 04/09/2017 CLINICAL DATA:  Diverticulitis EXAM: CT ABDOMEN AND PELVIS WITHOUT CONTRAST TECHNIQUE: Multidetector CT imaging of the abdomen and pelvis was performed following the standard protocol without IV contrast. COMPARISON:  PET-CT 10/14/2016 FINDINGS: Lower chest: Atheromatous aortic calcifications. No pleural or pericardial  effusion. Subpleural scarring or atelectasis posteriorly in the visualized lower lobes. Hepatobiliary: No focal liver abnormality is seen. No gallstones, gallbladder wall thickening, or biliary dilatation. Pancreas: Diffuse atrophy without ductal dilatation or mass. Spleen: Normal in size without focal abnormality. Adrenals/Urinary Tract: Normal adrenals. Mild hydronephrosis, increased on the right since previous exam. New right ureterectasis. Urinary bladder is physiologically distended. Stomach/Bowel: Stomach is nondistended. Small bowel is nondilated. There is a loop of distal ileum in left inguinal hernia without suggestion of obstruction or strangulation. Normal appendix. The colon is nondilated with suggestion of wall thickening in the proximal transverse segment and more conspicuously circumferentially in the distal descending and sigmoid segments, with adjacent mild inflammatory/edematous changes. No significant diverticular disease. Vascular/Lymphatic: Extensive aortic atheromatous plaque involving visceral and iliac arteries as well, without aneurysm. Ectatic infrarenal segment with stable partially calcified eccentric mural thrombus. Reproductive: Marked prostatic enlargement. Subcentimeter left para-aortic lymph nodes. No pelvic adenopathy. Other: No ascites.  No free air. Musculoskeletal: Left inguinal hernia containing a loop of small bowel without evidence of obstruction or strangulation. Spondylitic changes in the lower lumbar spine. No fracture or worrisome bone lesion. IMPRESSION: 1. Nonspecific colitis involving transverse, descending and sigmoid segments. 2. Progressive hydronephrosis right greater than left, and right ureterectasis since previous PET-CT of 10/14/2016. 3. Left inguinal hernia involving a loop of ileum, without obstruction or strangulation. 4. Aortic Atherosclerosis (ICD10-170.0). Ectatic abdominal aorta at risk for aneurysm development. Recommend followup by ultrasound in 5  years. This recommendation follows ACR consensus guidelines: White Paper of the ACR Incidental Findings Committee II on Vascular Findings. J Am Coll Radiol 2013; 10:789-794. Electronically Signed   By: Lucrezia Europe M.D.   On: 04/09/2017 11:35    Pending Labs Unresulted Labs (From admission, onward)   Start     Ordered   04/10/17 7353  Basic metabolic panel  Tomorrow morning,   R     04/09/17 1605   04/09/17 1602  Urinalysis, Routine w reflex microscopic  Once,   R     04/09/17 1605   04/09/17 1028  Gastrointestinal Panel by PCR , Stool  (Gastrointestinal Panel by PCR, Stool)  Once,   R     04/09/17 1027   04/09/17 1028  C difficile quick scan w PCR reflex  (C Difficile  quick screen w PCR reflex panel)  Once, for 48 hours,   R    Comments:  Laxatives (last 72 hours)   None     Question Answer Comment  Is your patient experiencing loose or watery stools (3 or more in 24 hours)? Yes   Has the patient received laxatives in the last 24 hours? No   Has a negative Cdiff test resulted in the last 7 days? No      04/09/17 1027   04/09/17 0947  Urinalysis, Routine w reflex microscopic  STAT,   STAT     04/09/17 0946      Vitals/Pain Today's Vitals   04/09/17 1230 04/09/17 1300 04/09/17 1400 04/09/17 1606  BP: (!) 102/50 (!) 106/54 127/82 109/61  Pulse: (!) 126 60 73 69  Resp: (!) 23 (!) 23 (!) 27 (!) 21  Temp:      TempSrc:      SpO2: 94% 94% 94% 99%    Isolation Precautions Enteric precautions (UV disinfection)  Medications Medications  iopamidol (ISOVUE-300) 61 % injection (  Canceled Entry 04/09/17 1121)  calcium acetate (PHOSLO) capsule 667 mg (not administered)  ferrous sulfate tablet 325 mg (not administered)  finasteride (PROSCAR) tablet 5 mg (not administered)  furosemide (LASIX) tablet 80 mg (not administered)  pravastatin (PRAVACHOL) tablet 10 mg (not administered)  potassium chloride 20 MEQ/15ML (10%) solution 20 mEq (not administered)  sodium bicarbonate tablet 1,300  mg (not administered)  tamsulosin (FLOMAX) capsule 0.4 mg (not administered)  triamcinolone cream (KENALOG) 0.5 % 1 application (not administered)  heparin injection 5,000 Units (not administered)  metroNIDAZOLE (FLAGYL) IVPB 500 mg (not administered)  0.9 %  sodium chloride infusion (not administered)  sodium chloride 0.9 % bolus 1,000 mL (0 mLs Intravenous Stopped 04/09/17 1207)  potassium chloride SA (K-DUR,KLOR-CON) CR tablet 40 mEq (40 mEq Oral Given 04/09/17 1558)    Mobility non-ambulatory- has not walked in ED, has cane at bedside

## 2017-04-10 DIAGNOSIS — K529 Noninfective gastroenteritis and colitis, unspecified: Secondary | ICD-10-CM

## 2017-04-10 LAB — GASTROINTESTINAL PANEL BY PCR, STOOL (REPLACES STOOL CULTURE)
ADENOVIRUS F40/41: NOT DETECTED
Astrovirus: NOT DETECTED
CAMPYLOBACTER SPECIES: NOT DETECTED
CRYPTOSPORIDIUM: NOT DETECTED
CYCLOSPORA CAYETANENSIS: NOT DETECTED
ENTEROPATHOGENIC E COLI (EPEC): NOT DETECTED
Entamoeba histolytica: NOT DETECTED
Enteroaggregative E coli (EAEC): NOT DETECTED
Enterotoxigenic E coli (ETEC): NOT DETECTED
GIARDIA LAMBLIA: NOT DETECTED
Norovirus GI/GII: NOT DETECTED
PLESIMONAS SHIGELLOIDES: NOT DETECTED
Rotavirus A: NOT DETECTED
SALMONELLA SPECIES: NOT DETECTED
SHIGELLA/ENTEROINVASIVE E COLI (EIEC): NOT DETECTED
Sapovirus (I, II, IV, and V): NOT DETECTED
Shiga like toxin producing E coli (STEC): NOT DETECTED
VIBRIO SPECIES: NOT DETECTED
Vibrio cholerae: NOT DETECTED
Yersinia enterocolitica: NOT DETECTED

## 2017-04-10 LAB — BASIC METABOLIC PANEL
ANION GAP: 10 (ref 5–15)
BUN: 67 mg/dL — ABNORMAL HIGH (ref 6–20)
CHLORIDE: 110 mmol/L (ref 101–111)
CO2: 18 mmol/L — AB (ref 22–32)
Calcium: 6.9 mg/dL — ABNORMAL LOW (ref 8.9–10.3)
Creatinine, Ser: 4.53 mg/dL — ABNORMAL HIGH (ref 0.61–1.24)
GFR calc non Af Amer: 11 mL/min — ABNORMAL LOW (ref >60)
GFR, EST AFRICAN AMERICAN: 13 mL/min — AB (ref >60)
GLUCOSE: 116 mg/dL — AB (ref 65–99)
POTASSIUM: 2.6 mmol/L — AB (ref 3.5–5.1)
Sodium: 138 mmol/L (ref 135–145)

## 2017-04-10 MED ORDER — POTASSIUM CHLORIDE 10 MEQ/100ML IV SOLN
10.0000 meq | INTRAVENOUS | Status: AC
  Administered 2017-04-10 (×4): 10 meq via INTRAVENOUS
  Filled 2017-04-10 (×4): qty 100

## 2017-04-10 MED ORDER — VANCOMYCIN 50 MG/ML ORAL SOLUTION
125.0000 mg | Freq: Four times a day (QID) | ORAL | Status: DC
  Administered 2017-04-10 – 2017-04-12 (×9): 125 mg via ORAL
  Filled 2017-04-10 (×10): qty 2.5

## 2017-04-10 NOTE — Progress Notes (Signed)
CRITICAL VALUE ALERT  Critical Value: Potassium 2.6  Date & Time Notified: 04/10/2017  0502  Provider Notified: Silas Sacramento  Orders Received: Provider to assess and address

## 2017-04-10 NOTE — Progress Notes (Signed)
Patient ID: Marcus Lane, male   DOB: 01-Mar-1932, 84 y.o.   MRN: [id number]  PROGRESS NOTE    Marcus Lane  CVE:[id number] DOB: 1931-07-10 DOA: 04/09/2017  PCP: System, Pcp Not In   Brief Narrative:  84 y.o. male with PMH of HTN, Prostate CA, CKD (refused HD) presented with progressive generalized weakness and fatigue. Pt presented with progressive diarrhea over past 5 days PTA.CT abd showed colitis and his stool is positive for C.diff.  Assessment & Plan:   Active Problems: C.diff colitis - CT abd showed nonspecific colitis involving transverse, descending and sigmoid segments.  Pearletha Furl for C.diff positive - Started vanco PO  Bilateral hydronephrosis - CT scan showed progressive hydronephrosis right greater than left, and right ureterectasis since previous PET-CT of 10/14/2016.  - Refused HD  Acute kidney injury superimposed on CKD stage 3 / Metabolic acidosis  - Cr 0.25 - Continue IV fluids - Follow up BMP in am - Pt declined HD  Hypokalemia - Due to Acute on CKD - Supplemented   Anemia of CKD - Hgb stable    DVT prophylaxis: Hep subQ Code Status: full code  Family Communication: no family at the bedside  Disposition Plan: home once he feels better    Consultants:   None   Procedures:   None   Antimicrobials:   Vanco PO 2/3 -->   Subjective: Stomach ache.  Objective: Vitals:   04/09/17 1645 04/09/17 1824 04/09/17 1957 04/10/17 0343  BP: (!) 149/68  135/72 128/64  Pulse: 73  70 62  Resp: 20 20 16 12   Temp: 98.2 F (36.8 C)  98 F (36.7 C) 98.2 F (36.8 C)  TempSrc: Oral  Oral Oral  SpO2: 100%  100% 100%  Weight:  73.4 kg (161 lb 13.1 oz)    Height:  5\' 6"  (1.676 m)      Intake/Output Summary (Last 24 hours) at 04/10/2017 1421 Last data filed at 04/10/2017 8527 Gross per 24 hour  Intake 1170 ml  Output 600 ml  Net 570 ml   Filed Weights   04/09/17 1824  Weight: 73.4 kg (161 lb 13.1 oz)    Examination:  General exam: Appears calm and  comfortable  Respiratory system: Clear to auscultation. Respiratory effort normal. Cardiovascular system: S1 & S2 heard, RRR.  Gastrointestinal system: Abdomen is distended, (+) BS Central nervous system: Alert and oriented. No focal neurological deficits. Extremities: Symmetric 5 x 5 power. Skin: No rashes, lesions or ulcers Psychiatry: Judgement and insight appear normal. Mood & affect appropriate.   Data Reviewed: I have personally reviewed following labs and imaging studies  CBC: Recent Labs  Lab 04/09/17 1002  WBC 13.1*  HGB 11.0*  HCT 33.2*  MCV 88.5  PLT 782   Basic Metabolic Panel: Recent Labs  Lab 04/09/17 1002 04/10/17 0345  NA 136 138  K 2.8* 2.6*  CL 106 110  CO2 20* 18*  GLUCOSE 112* 116*  BUN 65* 67*  CREATININE 4.66* 4.53*  CALCIUM 7.4* 6.9*   GFR: Estimated Creatinine Clearance: 11.1 mL/min (A) (by C-G formula based on SCr of 4.53 mg/dL (H)). Liver Function Tests: Recent Labs  Lab 04/09/17 1002  AST 33  ALT 15*  ALKPHOS 64  BILITOT 0.7  PROT 4.5*  ALBUMIN 1.7*   Recent Labs  Lab 04/09/17 1002  LIPASE 19   No results for input(s): AMMONIA in the last 168 hours. Coagulation Profile: No results for input(s): INR, PROTIME in the last 168 hours. Cardiac Enzymes: No  results for input(s): CKTOTAL, CKMB, CKMBINDEX, TROPONINI in the last 168 hours. BNP (last 3 results) No results for input(s): PROBNP in the last 8760 hours. HbA1C: No results for input(s): HGBA1C in the last 72 hours. CBG: No results for input(s): GLUCAP in the last 168 hours. Lipid Profile: No results for input(s): CHOL, HDL, LDLCALC, TRIG, CHOLHDL, LDLDIRECT in the last 72 hours. Thyroid Function Tests: No results for input(s): TSH, T4TOTAL, FREET4, T3FREE, THYROIDAB in the last 72 hours. Anemia Panel: No results for input(s): VITAMINB12, FOLATE, FERRITIN, TIBC, IRON, RETICCTPCT in the last 72 hours. Urine analysis:    Component Value Date/Time   COLORURINE YELLOW  04/09/2017 1947   APPEARANCEUR CLEAR 04/09/2017 1947   LABSPEC 1.013 04/09/2017 1947   PHURINE 6.0 04/09/2017 1947   GLUCOSEU 150 (A) 04/09/2017 1947   HGBUR MODERATE (A) 04/09/2017 [place] NEGATIVE 04/09/2017 1947   KETONESUR 5 (A) 04/09/2017 1947   PROTEINUR >=300 (A) 04/09/2017 1947   NITRITE NEGATIVE 04/09/2017 1947   LEUKOCYTESUR NEGATIVE 04/09/2017 1947   Sepsis Labs: @LABRCNTIP (procalcitonin:4,lacticidven:4)    Recent Results (from the past 240 hour(s))  Gastrointestinal Panel by PCR , Stool     Status: None   Collection Time: 04/09/17  2:03 PM  Result Value Ref Range Status   Campylobacter species NOT DETECTED NOT DETECTED Final   Plesimonas shigelloides NOT DETECTED NOT DETECTED Final   Salmonella species NOT DETECTED NOT DETECTED Final   Yersinia enterocolitica NOT DETECTED NOT DETECTED Final   Vibrio species NOT DETECTED NOT DETECTED Final   Vibrio cholerae NOT DETECTED NOT DETECTED Final   Enteroaggregative E coli (EAEC) NOT DETECTED NOT DETECTED Final   Enteropathogenic E coli (EPEC) NOT DETECTED NOT DETECTED Final   Enterotoxigenic E coli (ETEC) NOT DETECTED NOT DETECTED Final   Shiga like toxin producing E coli (STEC) NOT DETECTED NOT DETECTED Final   Shigella/Enteroinvasive E coli (EIEC) NOT DETECTED NOT DETECTED Final   Cryptosporidium NOT DETECTED NOT DETECTED Final   Cyclospora cayetanensis NOT DETECTED NOT DETECTED Final   Entamoeba histolytica NOT DETECTED NOT DETECTED Final   Giardia lamblia NOT DETECTED NOT DETECTED Final   Adenovirus F40/41 NOT DETECTED NOT DETECTED Final   Astrovirus NOT DETECTED NOT DETECTED Final   Norovirus GI/GII NOT DETECTED NOT DETECTED Final   Rotavirus A NOT DETECTED NOT DETECTED Final   Sapovirus (I, II, IV, and V) NOT DETECTED NOT DETECTED Final    Comment: Performed at [facility], [address] [place], [place] [address]  C difficile quick scan w PCR reflex     Status: Abnormal   Collection Time:  04/09/17  2:03 PM  Result Value Ref Range Status   C Diff antigen POSITIVE (A) NEGATIVE Final   C Diff toxin POSITIVE (A) NEGATIVE Final   C Diff interpretation Toxin producing C. difficile detected.  Final    Comment: CRITICAL RESULT CALLED TO, READ BACK BY AND VERIFIED WITH: P.DOWD RN 04/09/2016 1641 JR Performed at [facility], [place] [address], [place], [place] [address]       Radiology Studies: Ct Abdomen Pelvis Wo Contrast  Result Date: 04/09/2017 CLINICAL DATA:  Diverticulitis EXAM: CT ABDOMEN AND PELVIS WITHOUT CONTRAST TECHNIQUE: Multidetector CT imaging of the abdomen and pelvis was performed following the standard protocol without IV contrast. COMPARISON:  PET-CT 10/14/2016 FINDINGS: Lower chest: Atheromatous aortic calcifications. No pleural or pericardial effusion. Subpleural scarring or atelectasis posteriorly in the visualized lower lobes. Hepatobiliary: No focal liver abnormality is seen.  No gallstones, gallbladder wall thickening, or biliary dilatation. Pancreas: Diffuse atrophy without ductal dilatation or mass. Spleen: Normal in size without focal abnormality. Adrenals/Urinary Tract: Normal adrenals. Mild hydronephrosis, increased on the right since previous exam. New right ureterectasis. Urinary bladder is physiologically distended. Stomach/Bowel: Stomach is nondistended. Small bowel is nondilated. There is a loop of distal ileum in left inguinal hernia without suggestion of obstruction or strangulation. Normal appendix. The colon is nondilated with suggestion of wall thickening in the proximal transverse segment and more conspicuously circumferentially in the distal descending and sigmoid segments, with adjacent mild inflammatory/edematous changes. No significant diverticular disease. Vascular/Lymphatic: Extensive aortic atheromatous plaque involving visceral and iliac arteries as well, without aneurysm. Ectatic infrarenal segment with stable partially calcified  eccentric mural thrombus. Reproductive: Marked prostatic enlargement. Subcentimeter left para-aortic lymph nodes. No pelvic adenopathy. Other: No ascites.  No free air. Musculoskeletal: Left inguinal hernia containing a loop of small bowel without evidence of obstruction or strangulation. Spondylitic changes in the lower lumbar spine. No fracture or worrisome bone lesion. IMPRESSION: 1. Nonspecific colitis involving transverse, descending and sigmoid segments. 2. Progressive hydronephrosis right greater than left, and right ureterectasis since previous PET-CT of 10/14/2016. 3. Left inguinal hernia involving a loop of ileum, without obstruction or strangulation. 4. Aortic Atherosclerosis (ICD10-170.0). Ectatic abdominal aorta at risk for aneurysm development. Recommend followup by ultrasound in 5 years. This recommendation follows ACR consensus guidelines: White Paper of the ACR Incidental Findings Committee II on Vascular Findings. J Am Coll Radiol 2013; 10:789-794. Electronically Signed   By: Lucrezia Europe M.D.   On: 04/09/2017 11:35       Scheduled Meds: . calcium acetate  667 mg Oral TID WC  . feeding supplement  1 Container Oral TID BM  . ferrous sulfate  325 mg Oral BID WC  . finasteride  5 mg Oral Daily  . furosemide  80 mg Oral 3 times per day  . heparin  5,000 Units Subcutaneous Q8H  . potassium chloride  20 mEq Oral BID  . pravastatin  10 mg Oral q1800  . sodium bicarbonate  1,300 mg Oral BID  . tamsulosin  0.4 mg Oral Daily  . triamcinolone cream  1 application Topical BID  . vancomycin  125 mg Oral QID   Continuous Infusions: . sodium chloride 50 mL/hr at 04/10/17 0540     LOS: 1 day    Time spent: 25 minutes  Greater than 50% of the time spent on counseling and coordinating the care.   Leisa Lenz, MD [facility] Pager [phone]  If 7PM-7AM, please contact night-coverage [email or web address] Password [email or web address] 04/10/2017, 2:21 PM

## 2017-04-11 LAB — BASIC METABOLIC PANEL
ANION GAP: 7 (ref 5–15)
BUN: 59 mg/dL — ABNORMAL HIGH (ref 6–20)
CALCIUM: 6.6 mg/dL — AB (ref 8.9–10.3)
CHLORIDE: 113 mmol/L — AB (ref 101–111)
CO2: 16 mmol/L — AB (ref 22–32)
Creatinine, Ser: 4.33 mg/dL — ABNORMAL HIGH (ref 0.61–1.24)
GFR calc non Af Amer: 11 mL/min — ABNORMAL LOW (ref >60)
GFR, EST AFRICAN AMERICAN: 13 mL/min — AB (ref >60)
Glucose, Bld: 126 mg/dL — ABNORMAL HIGH (ref 65–99)
Potassium: 3 mmol/L — ABNORMAL LOW (ref 3.5–5.1)
Sodium: 136 mmol/L (ref 135–145)

## 2017-04-11 LAB — CBC
HEMATOCRIT: 29.6 % — AB (ref 39.0–52.0)
HEMOGLOBIN: 10.2 g/dL — AB (ref 13.0–17.0)
MCH: 30.4 pg (ref 26.0–34.0)
MCHC: 34.5 g/dL (ref 30.0–36.0)
MCV: 88.1 fL (ref 78.0–100.0)
Platelets: 185 10*3/uL (ref 150–400)
RBC: 3.36 MIL/uL — ABNORMAL LOW (ref 4.22–5.81)
RDW: 16.3 % — AB (ref 11.5–15.5)
WBC: 10.5 10*3/uL (ref 4.0–10.5)

## 2017-04-11 NOTE — Progress Notes (Signed)
Patient ID: Marcus Lane, male   DOB: 02-17-1932, 84 y.o.   MRN: [id number]  PROGRESS NOTE    Marcus Lane  WUJ:[id number] DOB: Feb 28, 1932 DOA: 04/09/2017  PCP: System, Pcp Not In   Brief Narrative:  84 y.o. male with PMH of HTN, Prostate CA, CKD (refused HD) presented with progressive generalized weakness and fatigue. Pt presented with progressive diarrhea over past 5 days PTA.CT abd showed colitis and his stool is positive for C.diff.  Assessment & Plan:   Active Problems: C.diff colitis - CT abd showed nonspecific colitis involving transverse, descending and sigmoid segments.  - Stool for C.diff positive  - Continue PO vanco   Bilateral hydronephrosis - CT scan showed progressive hydronephrosis right greater than left, and right ureterectasis since previous PET-CT of 10/14/2016.  - Refused HD  Acute Lane injury superimposed on CKD stage 3 / Metabolic acidosis  - Cr 9.56 - Cr slightly down this am, 4.33 - Continue phoslo, sodium bicarb   Hypokalemia - Due to diarrhea  - Supplemented   Anemia of CKD - Hgb stable   Dyslipidemia - Continue Pravachol   DVT prophylaxis: Hep subQ Code Status: full code  Family Communication: no family at bedside  Disposition Plan: home in next 48 hours if diarrhea improves    Consultants:   None   Procedures:   None   Antimicrobials:   Vanco PO 2/3 -->   Subjective: No overnight events.   Objective: Vitals:   04/10/17 0343 04/10/17 1442 04/10/17 2116 04/11/17 0443  BP: 128/64 (!) 119/57 (!) 124/56 (!) 132/59  Pulse: 62 78 82 80  Resp: 12 16 12 14   Temp: 98.2 F (36.8 C) 100.1 F (37.8 C) 99 F (37.2 C) 98.2 F (36.8 C)  TempSrc: Oral Oral Oral Oral  SpO2: 100% 99% 100% 100%  Weight:      Height:        Intake/Output Summary (Last 24 hours) at 04/11/2017 1026 Last data filed at 04/11/2017 0900 Gross per 24 hour  Intake 1647.5 ml  Output -  Net 1647.5 ml   Filed Weights   04/09/17 1824  Weight: 73.4 kg (161  lb 13.1 oz)    Physical Exam  Constitutional: Appears well-developed and well-nourished. No distress.  CVS: RRR, S1/S2 + Pulmonary: Effort and breath sounds normal, no stridor, rhonchi, wheezes, rales.  Abdominal: BS +,  Distended, tender in mid abdomen, no rebound tenderness or guarding  Musculoskeletal: Normal range of motion. No edema and no tenderness.  Lymphadenopathy: No lymphadenopathy noted, cervical, inguinal. Neuro: Alert. Normal reflexes, muscle tone coordination. No cranial nerve deficit. Skin: Skin is warm and dry.  Psychiatric: Normal mood and affect. Behavior, judgment, thought content normal.    Data Reviewed: I have personally reviewed following labs and imaging studies  CBC: Recent Labs  Lab 04/09/17 1002 04/11/17 0348  WBC 13.1* 10.5  HGB 11.0* 10.2*  HCT 33.2* 29.6*  MCV 88.5 88.1  PLT 190 213   Basic Metabolic Panel: Recent Labs  Lab 04/09/17 1002 04/10/17 0345 04/11/17 0348  NA 136 138 136  K 2.8* 2.6* 3.0*  CL 106 110 113*  CO2 20* 18* 16*  GLUCOSE 112* 116* 126*  BUN 65* 67* 59*  CREATININE 4.66* 4.53* 4.33*  CALCIUM 7.4* 6.9* 6.6*   GFR: Estimated Creatinine Clearance: 11.7 mL/min (A) (by C-G formula based on SCr of 4.33 mg/dL (H)). Liver Function Tests: Recent Labs  Lab 04/09/17 1002  AST 33  ALT 15*  ALKPHOS 64  BILITOT  0.7  PROT 4.5*  ALBUMIN 1.7*   Recent Labs  Lab 04/09/17 1002  LIPASE 19   No results for input(s): AMMONIA in the last 168 hours. Coagulation Profile: No results for input(s): INR, PROTIME in the last 168 hours. Cardiac Enzymes: No results for input(s): CKTOTAL, CKMB, CKMBINDEX, TROPONINI in the last 168 hours. BNP (last 3 results) No results for input(s): PROBNP in the last 8760 hours. HbA1C: No results for input(s): HGBA1C in the last 72 hours. CBG: No results for input(s): GLUCAP in the last 168 hours. Lipid Profile: No results for input(s): CHOL, HDL, LDLCALC, TRIG, CHOLHDL, LDLDIRECT in the last  72 hours. Thyroid Function Tests: No results for input(s): TSH, T4TOTAL, FREET4, T3FREE, THYROIDAB in the last 72 hours. Anemia Panel: No results for input(s): VITAMINB12, FOLATE, FERRITIN, TIBC, IRON, RETICCTPCT in the last 72 hours. Urine analysis:    Component Value Date/Time   COLORURINE YELLOW 04/09/2017 1947   APPEARANCEUR CLEAR 04/09/2017 1947   LABSPEC 1.013 04/09/2017 1947   PHURINE 6.0 04/09/2017 1947   GLUCOSEU 150 (A) 04/09/2017 1947   HGBUR MODERATE (A) 04/09/2017 [place] NEGATIVE 04/09/2017 1947   KETONESUR 5 (A) 04/09/2017 1947   PROTEINUR >=300 (A) 04/09/2017 1947   NITRITE NEGATIVE 04/09/2017 1947   LEUKOCYTESUR NEGATIVE 04/09/2017 1947   Sepsis Labs: @LABRCNTIP (procalcitonin:4,lacticidven:4)    Recent Results (from the past 240 hour(s))  Gastrointestinal Panel by PCR , Stool     Status: None   Collection Time: 04/09/17  2:03 PM  Result Value Ref Range Status   Campylobacter species NOT DETECTED NOT DETECTED Final   Plesimonas shigelloides NOT DETECTED NOT DETECTED Final   Salmonella species NOT DETECTED NOT DETECTED Final   Yersinia enterocolitica NOT DETECTED NOT DETECTED Final   Vibrio species NOT DETECTED NOT DETECTED Final   Vibrio cholerae NOT DETECTED NOT DETECTED Final   Enteroaggregative E coli (EAEC) NOT DETECTED NOT DETECTED Final   Enteropathogenic E coli (EPEC) NOT DETECTED NOT DETECTED Final   Enterotoxigenic E coli (ETEC) NOT DETECTED NOT DETECTED Final   Shiga like toxin producing E coli (STEC) NOT DETECTED NOT DETECTED Final   Shigella/Enteroinvasive E coli (EIEC) NOT DETECTED NOT DETECTED Final   Cryptosporidium NOT DETECTED NOT DETECTED Final   Cyclospora cayetanensis NOT DETECTED NOT DETECTED Final   Entamoeba histolytica NOT DETECTED NOT DETECTED Final   Giardia lamblia NOT DETECTED NOT DETECTED Final   Adenovirus F40/41 NOT DETECTED NOT DETECTED Final   Astrovirus NOT DETECTED NOT DETECTED Final   Norovirus GI/GII NOT  DETECTED NOT DETECTED Final   Rotavirus A NOT DETECTED NOT DETECTED Final   Sapovirus (I, II, IV, and V) NOT DETECTED NOT DETECTED Final    Comment: Performed at [facility], [address] [place], [place] [address]  C difficile quick scan w PCR reflex     Status: Abnormal   Collection Time: 04/09/17  2:03 PM  Result Value Ref Range Status   C Diff antigen POSITIVE (A) NEGATIVE Final   C Diff toxin POSITIVE (A) NEGATIVE Final   C Diff interpretation Toxin producing C. difficile detected.  Final    Comment: CRITICAL RESULT CALLED TO, READ BACK BY AND VERIFIED WITH: P.DOWD RN 04/09/2016 1641 JR Performed at [facility], [place] [address], [place], [place] [address]       Radiology Studies: Ct Abdomen Pelvis Wo Contrast  Result Date: 04/09/2017 CLINICAL DATA:  Diverticulitis EXAM: CT ABDOMEN AND PELVIS WITHOUT CONTRAST TECHNIQUE: Multidetector CT imaging  of the abdomen and pelvis was performed following the standard protocol without IV contrast. COMPARISON:  PET-CT 10/14/2016 FINDINGS: Lower chest: Atheromatous aortic calcifications. No pleural or pericardial effusion. Subpleural scarring or atelectasis posteriorly in the visualized lower lobes. Hepatobiliary: No focal liver abnormality is seen. No gallstones, gallbladder wall thickening, or biliary dilatation. Pancreas: Diffuse atrophy without ductal dilatation or mass. Spleen: Normal in size without focal abnormality. Adrenals/Urinary Tract: Normal adrenals. Mild hydronephrosis, increased on the right since previous exam. New right ureterectasis. Urinary bladder is physiologically distended. Stomach/Bowel: Stomach is nondistended. Small bowel is nondilated. There is a loop of distal ileum in left inguinal hernia without suggestion of obstruction or strangulation. Normal appendix. The colon is nondilated with suggestion of wall thickening in the proximal transverse segment and more conspicuously circumferentially in the  distal descending and sigmoid segments, with adjacent mild inflammatory/edematous changes. No significant diverticular disease. Vascular/Lymphatic: Extensive aortic atheromatous plaque involving visceral and iliac arteries as well, without aneurysm. Ectatic infrarenal segment with stable partially calcified eccentric mural thrombus. Reproductive: Marked prostatic enlargement. Subcentimeter left para-aortic lymph nodes. No pelvic adenopathy. Other: No ascites.  No free air. Musculoskeletal: Left inguinal hernia containing a loop of small bowel without evidence of obstruction or strangulation. Spondylitic changes in the lower lumbar spine. No fracture or worrisome bone lesion. IMPRESSION: 1. Nonspecific colitis involving transverse, descending and sigmoid segments. 2. Progressive hydronephrosis right greater than left, and right ureterectasis since previous PET-CT of 10/14/2016. 3. Left inguinal hernia involving a loop of ileum, without obstruction or strangulation. 4. Aortic Atherosclerosis (ICD10-170.0). Ectatic abdominal aorta at risk for aneurysm development. Recommend followup by ultrasound in 5 years. This recommendation follows ACR consensus guidelines: White Paper of the ACR Incidental Findings Committee II on Vascular Findings. J Am Coll Radiol 2013; 10:789-794. Electronically Signed   By: Lucrezia Europe M.D.   On: 04/09/2017 11:35       Scheduled Meds: . calcium acetate  667 mg Oral TID WC  . feeding supplement  1 Container Oral TID BM  . ferrous sulfate  325 mg Oral BID WC  . finasteride  5 mg Oral Daily  . furosemide  80 mg Oral 3 times per day  . heparin  5,000 Units Subcutaneous Q8H  . potassium chloride  20 mEq Oral BID  . pravastatin  10 mg Oral q1800  . sodium bicarbonate  1,300 mg Oral BID  . tamsulosin  0.4 mg Oral Daily  . triamcinolone cream  1 application Topical BID  . vancomycin  125 mg Oral QID   Continuous Infusions: . sodium chloride 50 mL/hr at 04/11/17 0523     LOS: 2  days    Time spent: 25 minutes  Greater than 50% of the time spent on counseling and coordinating the care.   Leisa Lenz, MD [facility] Pager [phone]  If 7PM-7AM, please contact night-coverage [email or web address] Password [email or web address] 04/11/2017, 10:26 AM

## 2017-04-11 NOTE — Progress Notes (Signed)
Initial Nutrition Assessment  DOCUMENTATION CODES:   Not applicable  INTERVENTION:  Boost Breeze po TID, each supplement provides 250kcals and 9g of protein.  Dietetic intern to speak with Dr. Charlies Silvers about possible diet advancement.   NUTRITION DIAGNOSIS:   Inadequate oral intake related to other (see comment)(Clear liquid diet) as evidenced by estimated needs.  GOAL:   Patient will meet greater than or equal to 90% of their needs  MONITOR:   Diet advancement, Supplement acceptance, Labs, PO intake, I & O's  REASON FOR ASSESSMENT:   Malnutrition Screening Tool    ASSESSMENT:   84 y.o.malewith PMH of HTN, Prostate cancer, CKD (refused HD). Admitted  with progressive generalized weakness, fatigue, diarrhea over past 5 days PTA. CT abd showed colitis; positive for C.diff.  Pt reports no N/V currently. Pt also denies nausea PTA and reports only 1 episode of vomiting. Pt reports severe weakening due to diarrhea starting Friday (2/1). Pt struggled to remember dietary recall during admission and PTA.  Pt states he does not like the liquid diet ("too much liquid") and would very much like his diet to be changed.   Pt reports eating creamed chicken soup for breakfast this morning. At time of visit pt received broth and ice cream for lunch, which he began eating.  Pt had trouble remembering if he drank his supplements. He states he had one Boost Breeze yesterday. Per medication history, pt was given Boost Breeze 3x yesterday. At time of visit, supplement was opened but very little was consumed.   PTA recent episode of weakness/diarrhea leading to current admission, pt reports a good appetite and consuming 3 regular meals a day for medication regimen. He denies trouble chewing or swallowing.   Pt could not remember any recent wt loss stating his UBW he last remembers PTA was 165lb.  Per recorded weights, pt has had 2% weight loss over 1 month which is not significant. Prior weight on  03/11/17 may not have been a scaled wt.   Medications: Phoslo, iron, lasix, KCl (p.o), vacomycin IV fluids  Labs: K (L), glucose (H).  NUTRITION - FOCUSED PHYSICAL EXAM:    Most Recent Value  Orbital Region  No depletion  Upper Arm Region  Moderate depletion  Thoracic and Lumbar Region  Unable to assess  Buccal Region  No depletion  Temple Region  Moderate depletion  Clavicle Bone Region  No depletion  Clavicle and Acromion Bone Region  No depletion  Scapular Bone Region  No depletion  Dorsal Hand  No depletion  Patellar Region  Mild depletion  Anterior Thigh Region  Mild depletion  Posterior Calf Region  Mild depletion  Edema (RD Assessment)  Mild       Diet Order:  Diet clear liquid Room service appropriate? Yes; Fluid consistency: Thin  EDUCATION NEEDS:   No education needs have been identified at this time  Skin:  Skin Assessment: Reviewed RN Assessment  Last BM:  2/3   Height:   Ht Readings from Last 1 Encounters:  04/09/17 5\' 6"  (1.676 m)    Weight:   Wt Readings from Last 1 Encounters:  04/09/17 161 lb 13.1 oz (73.4 kg)    Ideal Body Weight:     BMI:  Body mass index is 26.12 kg/m.  Estimated Nutritional Needs:   Kcal:  1600-1800  Protein:  80-90  Fluid:  1.6-1.8   Karell Tukes, MS, Dietetic Intern Pager # [phone]

## 2017-04-11 NOTE — Evaluation (Signed)
Physical Therapy Evaluation Patient Details Name: Marcus Lane MRN: [id number] DOB: August 18, 1931 Today's Date: 04/11/2017   History of Present Illness  84 y.o. male with PMH of HTN, Prostate CA, CKD (refused HD) presented with progressive generalized weakness and fatigue, found to have c. diff colitis  Clinical Impression  Pt admitted with above diagnosis. Pt currently with functional limitations due to the deficits listed below (see PT Problem List).  Pt will benefit from skilled PT to increase their independence and safety with mobility to allow discharge to the venue listed below.  Pt is from home alone and currently with limited mobility due to generalized weakness and limited endurance.  Pt may need SNF upon d/c.  Will continue to follow in acute and update recommendations as appropriate.     Follow Up Recommendations SNF;Supervision for mobility/OOB    Equipment Recommendations  Rolling walker with 5" wheels    Recommendations for Other Services       Precautions / Restrictions Precautions Precautions: Fall      Mobility  Bed Mobility Overal bed mobility: Needs Assistance Bed Mobility: Supine to Sit     Supine to sit: Min guard     General bed mobility comments: required HHA however pt able to bring himself upright  Transfers Overall transfer level: Needs assistance Equipment used: Rolling walker (2 wheeled) Transfers: Sit to/from Stand Sit to Stand: Min assist         General transfer comment: verbal cues for hand placement, slight assist to rise  Ambulation/Gait Ambulation/Gait assistance: Min guard Ambulation Distance (Feet): 16 Feet Assistive device: Rolling walker (2 wheeled) Gait Pattern/deviations: Step-through pattern;Decreased stride length     General Gait Details: distance limited to room (?incontinence with cdiff) however pt also fatigued very quickly, reports being in bed 3 days  Stairs            Wheelchair Mobility    Modified Rankin  (Stroke Patients Only)       Balance Overall balance assessment: Needs assistance         Standing balance support: Bilateral upper extremity supported Standing balance-Leahy Scale: Poor Standing balance comment: requires UE support                             Pertinent Vitals/Pain Pain Assessment: No/denies pain    Home Living Family/patient expects to be discharged to:: Private residence Living Arrangements: Alone           Home Layout: Two level Home Equipment: Cane - single point Additional Comments: reports 12 steps which he typically uses rail to "pull himself"    Prior Function Level of Independence: Independent with assistive device(s)               Hand Dominance        Extremity/Trunk Assessment        Lower Extremity Assessment Lower Extremity Assessment: Generalized weakness    Cervical / Trunk Assessment Cervical / Trunk Assessment: Normal  Communication   Communication: No difficulties;Prefers language other than English(Spanish speaking, however able to communicate well in [place] )  Cognition Arousal/Alertness: Awake/alert Behavior During Therapy: WFL for tasks assessed/performed Overall Cognitive Status: Within Functional Limits for tasks assessed                                        General Comments  Exercises     Assessment/Plan    PT Assessment Patient needs continued PT services  PT Problem List Decreased strength;Decreased mobility;Decreased activity tolerance;Decreased knowledge of use of DME       PT Treatment Interventions Gait training;Therapeutic exercise;Patient/family education;DME instruction;Therapeutic activities;Functional mobility training;Stair training;Balance training    PT Goals (Current goals can be found in the Care Plan section)  Acute Rehab PT Goals PT Goal Formulation: With patient Time For Goal Achievement: 04/18/17 Potential to Achieve Goals: Good     Frequency Min 3X/week   Barriers to discharge        Co-evaluation               AM-PAC PT "6 Clicks" Daily Activity  Outcome Measure Difficulty turning over in bed (including adjusting bedclothes, sheets and blankets)?: None Difficulty moving from lying on back to sitting on the side of the bed? : A Little Difficulty sitting down on and standing up from a chair with arms (e.g., wheelchair, bedside commode, etc,.)?: A Little Help needed moving to and from a bed to chair (including a wheelchair)?: A Little Help needed walking in hospital room?: A Little Help needed climbing 3-5 steps with a railing? : A Lot 6 Click Score: 18    End of Session   Activity Tolerance: Patient limited by fatigue Patient left: in chair;with call bell/phone within reach Nurse Communication: Mobility status(NT aware pt up in recliner, pt aware to call for assist out of chair) PT Visit Diagnosis: Difficulty in walking, not elsewhere classified (R26.2);Muscle weakness (generalized) (M62.81)    Time: 1535-1550 PT Time Calculation (min) (ACUTE ONLY): 15 min   Charges:   PT Evaluation $PT Eval Low Complexity: 1 Low     PT G Codes:        Carmelia Bake, PT, DPT 04/11/2017 Pager: [phone]  York Ram E 04/11/2017, 4:16 PM

## 2017-04-12 LAB — BASIC METABOLIC PANEL
ANION GAP: 7 (ref 5–15)
BUN: 59 mg/dL — AB (ref 6–20)
CHLORIDE: 114 mmol/L — AB (ref 101–111)
CO2: 18 mmol/L — ABNORMAL LOW (ref 22–32)
Calcium: 7 mg/dL — ABNORMAL LOW (ref 8.9–10.3)
Creatinine, Ser: 4.24 mg/dL — ABNORMAL HIGH (ref 0.61–1.24)
GFR calc Af Amer: 14 mL/min — ABNORMAL LOW (ref >60)
GFR, EST NON AFRICAN AMERICAN: 12 mL/min — AB (ref >60)
Glucose, Bld: 105 mg/dL — ABNORMAL HIGH (ref 65–99)
POTASSIUM: 3.2 mmol/L — AB (ref 3.5–5.1)
SODIUM: 139 mmol/L (ref 135–145)

## 2017-04-12 LAB — CBC
HEMATOCRIT: 33.8 % — AB (ref 39.0–52.0)
HEMOGLOBIN: 11 g/dL — AB (ref 13.0–17.0)
MCH: 29.1 pg (ref 26.0–34.0)
MCHC: 32.5 g/dL (ref 30.0–36.0)
MCV: 89.4 fL (ref 78.0–100.0)
Platelets: 202 10*3/uL (ref 150–400)
RBC: 3.78 MIL/uL — AB (ref 4.22–5.81)
RDW: 16.3 % — AB (ref 11.5–15.5)
WBC: 7.8 10*3/uL (ref 4.0–10.5)

## 2017-04-12 MED ORDER — VANCOMYCIN 50 MG/ML ORAL SOLUTION: 125.0000 mg | Freq: Four times a day (QID) | ORAL | 0 refills | Status: AC

## 2017-04-12 MED ORDER — POTASSIUM CHLORIDE 20 MEQ/15ML (10%) PO SOLN
40.0000 meq | Freq: Once | ORAL | Status: AC
  Administered 2017-04-12: 40 meq via ORAL
  Filled 2017-04-12: qty 30

## 2017-04-12 MED ORDER — POTASSIUM CHLORIDE CRYS ER 20 MEQ PO TBCR: 40.0000 meq | EXTENDED_RELEASE_TABLET | Freq: Once | ORAL | Status: DC

## 2017-04-12 NOTE — Progress Notes (Signed)
Provided bed offers for SNFs to patient- pt [facility] close to his home and accepts [facility] Medicare.  Facility initiated insurance authorization request.  Pt shared concern that his glasses are in his homw and he will need them for next few days at [facility]. Called pt's friend Mr Deniece Ree who states he will come to hospital to get pt's keys and assist with gathering his belongings prior to admitting to [facility].  Will assist with transition to SNF once factors above are resolved.  Sharren Bridge, MSW, LCSW Clinical Social Work 04/12/2017 [phone]

## 2017-04-12 NOTE — NC FL2 (Signed)
[place] LEVEL OF CARE SCREENING TOOL     IDENTIFICATION  Patient Name: Marcus Lane Birthdate: [birth year] Sex: male Admission Date (Current Location): 04/09/2017  [facility] and [place] Number:  Herbalist and Address:  [facility],  [place], [place]      Provider Number: [id number]  Attending Physician Name and Address:  Robbie Lis, MD  Relative Name and Phone Number:       Current Level of Care: Hospital Recommended Level of Care: [place] Prior Approval Number:    Date Approved/Denied:   PASRR Number: [id number]  Discharge Plan: SNF    Current Diagnoses: Patient Active Problem List   Diagnosis Date Noted  . Colitis 04/09/2017    Orientation RESPIRATION BLADDER Height & Weight     Self, Time, Situation, Place  Normal Incontinent Weight: 161 lb 13.1 oz (73.4 kg) Height:  5\' 6"  (167.6 cm)  BEHAVIORAL SYMPTOMS/MOOD NEUROLOGICAL BOWEL NUTRITION STATUS      Incontinent Diet(soft diet- regular)  AMBULATORY STATUS COMMUNICATION OF NEEDS Skin   Limited Assist Verbally Normal                       Personal Care Assistance Level of Assistance  Bathing, Feeding, Dressing Bathing Assistance: Limited assistance Feeding assistance: Independent Dressing Assistance: Limited assistance     Functional Limitations Info  Sight, Hearing, Speech Sight Info: Adequate Hearing Info: Impaired(hard of hearing) Speech Info: Adequate    SPECIAL CARE FACTORS FREQUENCY  PT (By licensed PT), OT (By licensed OT)     PT Frequency: 5x OT Frequency: 5x            Contractures Contractures Info: Not present    Additional Factors Info  Code Status, Allergies , Isolation  enteric precautions Code Status Info: ful code Allergies Info: nka     enteric precautions- Cdiff       Current Medications (04/12/2017):  This is the current hospital active medication list Current Facility-Administered  Medications  Medication Dose Route Frequency Provider Last Rate Last Dose  . 0.9 %  sodium chloride infusion   Intravenous Continuous Kinnie Feil, MD 50 mL/hr at 04/12/17 0403    . calcium acetate (PHOSLO) capsule 667 mg  667 mg Oral TID WC Kinnie Feil, MD   667 mg at 04/12/17 0847  . feeding supplement (BOOST / RESOURCE BREEZE) liquid 1 Container  1 Container Oral TID BM Kinnie Feil, MD   1 Container at 04/11/17 2259  . ferrous sulfate tablet 325 mg  325 mg Oral BID WC Kinnie Feil, MD   325 mg at 04/12/17 0847  . finasteride (PROSCAR) tablet 5 mg  5 mg Oral Daily Kinnie Feil, MD   5 mg at 04/11/17 0959  . furosemide (LASIX) tablet 80 mg  80 mg Oral 3 times per day Kinnie Feil, MD   80 mg at 04/12/17 0847  . heparin injection 5,000 Units  5,000 Units Subcutaneous Q8H Kinnie Feil, MD   5,000 Units at 04/11/17 2259  . potassium chloride 20 MEQ/15ML (10%) solution 20 mEq  20 mEq Oral BID Kinnie Feil, MD   20 mEq at 04/11/17 2259  . pravastatin (PRAVACHOL) tablet 10 mg  10 mg Oral q1800 Kinnie Feil, MD   10 mg at 04/11/17 1620  . sodium bicarbonate tablet 1,300 mg  1,300 mg Oral BID Buriev, Arie Sabina, MD   1,300 mg  at 04/11/17 2259  . tamsulosin (FLOMAX) capsule 0.4 mg  0.4 mg Oral Daily Kinnie Feil, MD   0.4 mg at 04/11/17 1000  . triamcinolone cream (KENALOG) 0.5 % 1 application  1 application Topical BID Kinnie Feil, MD   1 application at 95/32/02 1000  . vancomycin (VANCOCIN) 50 mg/mL oral solution 125 mg  125 mg Oral QID Robbie Lis, MD   125 mg at 04/11/17 2259     Discharge Medications: Please see discharge summary for a list of discharge medications.  Relevant Imaging Results:  Relevant Lab Results:   Additional Information [phone]  Marcus Nephew, LCSW

## 2017-04-12 NOTE — Clinical Social Work Placement (Signed)
Pt discharged with plan to admit to [facility] for short term rehab- Room # [phone], report [phone]  [facility] transport arranged  DC information sent to facility via the [place]  Pt's friend Mr. Deniece Ree notified- he will bring pt his glasses and some belongings to [facility] this evening.  See below for placement details   CLINICAL SOCIAL WORK PLACEMENT  NOTE  Date:  04/12/2017  Patient Details  Name: Marcus Lane MRN: [id number] Date of Birth: [birth year]  Clinical Social Work is seeking post-discharge placement for this patient at the [place] level of care (*CSW will initial, date and re-position this form in  chart as items are completed):  Yes   Patient/family provided with [place] Work Department's list of facilities offering this level of care within the geographic area requested by the patient (or if unable, by the patient's family).  Yes   Patient/family informed of their freedom to choose among providers that offer the needed level of care, that participate in Medicare, Medicaid or managed care program needed by the patient, have an available bed and are willing to accept the patient.  Yes   Patient/family informed of 's ownership interest in [facility] and [facility], as well as of the fact that they are under no obligation to receive care at these facilities.  PASRR submitted to EDS on 04/12/17     PASRR number received on 04/12/17     Existing PASRR number confirmed on       FL2 transmitted to all facilities in geographic area requested by pt/family on 04/12/17     FL2 transmitted to all facilities within larger geographic area on       Patient informed that his/her managed care company has contracts with or will negotiate with certain facilities, including the following:  [facility]     Yes   Patient/family informed of bed offers received.  Patient chooses bed at [facility] recommends and patient chooses bed at [facility] and Rehab    Patient to be transferred to [facility] and Rehab on 04/12/17.  Patient to be transferred to facility by [facility]     Patient family notified on 04/12/17 of transfer.  Name of family member notified:  friend Mr. Deniece Ree     PHYSICIAN       Additional Comment:    _______________________________________________ Nila Nephew, LCSW 04/12/2017, 2:46 PM

## 2017-04-12 NOTE — Clinical Social Work Note (Signed)
Clinical Social Work Assessment  Patient Details  Name: Marcus Lane MRN: [id number] Date of Birth: Jul 27, 1931  Date of referral:  04/12/17               Reason for consult:  Facility Placement                Permission sought to share information with:    Permission granted to share information::     Name::        Agency::     Relationship::     Contact Information:     Housing/Transportation Living arrangements for the past 2 months:   private residence  Source of Information:  Patient Patient Interpreter Needed:  None Criminal Activity/Legal Involvement Pertinent to Current Situation/Hospitalization:  No - Comment as needed Significant Relationships:  None Lives with:  Self Do you feel safe going back to the place where you live?    Need for family participation in patient care:   No, patient explains that family is in [place] and did not indicate they are involved in care decisions.   Care giving concerns:  Patient presented to hospital after sliding off his bed at 0200 while getting up to go to bathroom, pt has had diarrhea for 8 days, no vomiting, pt is a cancer pt.      Social Worker assessment / plan:  CSW intern met with patient via bedside to discuss discharge planning as well as facility placement. Patient informed Probation officer that he is not aware of any skilled nursing facilities in the area, but is open to going to a facility. Patient told Probation officer that all of his family live in [place].   CSW intern completed assessment and FL2 is also being completed to send off to various facilities. CSW intern is awaiting bed offers.   Employment status:  Retired Nurse, adult PT Recommendations:  [place] / Referral to community resources:  [place]  Patient/Family's Response to care:  Patient was receptive and understanding of interventions set in place for discharge.   Patient/Family's Understanding of and  Emotional Response to Diagnosis, Current Treatment, and Prognosis:  Patient is understanding of current diagnosis and treatment. Patient is also aware of functional abilities.  Emotional Assessment Appearance:  Appears stated age Attitude/Demeanor/Rapport:    Affect (typically observed):  Accepting, Pleasant, Happy Orientation:  Oriented to Self, Oriented to Place, Oriented to  Time, Oriented to Situation Alcohol / Substance use:    Psych involvement (Current and /or in the community):  No (Comment)  Discharge Needs  Concerns to be addressed:  No discharge needs identified Readmission within the last 30 days:  No Current discharge risk:  None Barriers to Discharge:  No Barriers Identified   Willeen Niece, Student-Social Work 04/12/2017, 9:32 AM

## 2017-04-12 NOTE — Discharge Instructions (Signed)
Colitis (Colitis) La colitis es la inflamacin del colon y puede durar un breve perodo (aguda) o prolongarse mucho tiempo (crnica). CAUSAS Esta afeccin puede ser causada por lo siguiente:  Virus.  Bacterias.  Reacciones a medicamentos.  Algunas enfermedades autoinmunitarias, como la enfermedad de Crohn y la colitis ulcerosa. SNTOMAS Los sntomas de esta afeccin incluyen lo siguiente:  Diarrea.  Materia fecal sanguinolenta o alquitranada.  Dolor.  Fiebre.  Vmitos.  Cansancio (fatiga).  Prdida de peso.  Meteorismo.  Aumento repentino del dolor abdominal.  Menos deposiciones que lo habitual. DIAGNSTICO Esta afeccin se diagnostica mediante un anlisis de materia fecal o de sangre. Tambin pueden hacerse otros estudios, como radiografas, una tomografa computarizada (TC) o una colonoscopia. TRATAMIENTO El tratamiento puede incluir lo siguiente:  Dejar descansar a los intestinos, es decir, no consumir alimentos ni lquidos durante un tiempo.  Administracin de lquidos por va intravenosa (IV).  Medicamentos para el dolor y la diarrea.  Antibiticos.  Medicamentos con cortisona.  Ciruga. INSTRUCCIONES PARA EL CUIDADO EN EL HOGAR Comida y bebida  Siga las indicaciones del mdico respecto de las restricciones para las comidas o las bebidas.  Beba suficiente lquido para mantener la orina clara o de color amarillo plido.  Trabaje con un nutricionista para determinar cules son los alimentos que reagudizan la afeccin.  Evite los alimentos que reagudizan la afeccin.  Consumir una dieta bien balanceada. Medicamentos  Tome los medicamentos de venta libre y los recetados solamente como se lo haya indicado el mdico.  Si le recetaron un antibitico, tmelo como se lo haya indicado el mdico. No deje de tomar los antibiticos aunque comience a sentirse mejor. Instrucciones generales  Concurra a todas las visitas de control como se lo haya indicado  el mdico. Esto es importante. SOLICITE ATENCIN MDICA SI:  Los sntomas no desaparecen.  Presenta nuevos sntomas. SOLICITE ATENCIN MDICA DE INMEDIATO SI:  Tiene fiebre que no desaparece con tratamiento.  Comienza a sentir escalofros.  Se siente muy dbil, se desmaya o se deshidrata.  Ha vomitado repetidas veces.  Siente dolor intenso en el abdomen.  Su materia fecal es sanguinolenta o alquitranada. Esta informacin no tiene como fin reemplazar el consejo del mdico. Asegrese de hacerle al mdico cualquier pregunta que tenga. Document Released: 02/22/2005 Document Revised: 11/13/2014 Document Reviewed: 06/17/2014 Elsevier Interactive Patient Education  2018 Elsevier Inc.  

## 2017-04-12 NOTE — Discharge Summary (Signed)
Physician Discharge Summary  Marcus Lane PJK:[id number] DOB: November 24, 1931 DOA: 04/09/2017  PCP: System, Pcp Not In  Admit date: 04/09/2017 Discharge date: 04/12/2017  Recommendations for Outpatient Follow-up:  Continue vanco PO for 9 more days on discharge Patient refused hemodialysis  Discharge Diagnoses:  Active Problems:   Colitis    Discharge Condition: stable   Diet recommendation: as tolerated   History of present illness:  84 y.o.malewith PMH of HTN, Prostate CA, CKD (refused HD) presented with progressive generalized weakness and fatigue. Pt presented with progressive diarrhea over past 5 days PTA.CT abd showed colitis and his stool is positive for C.diff.  Hospital Course:    Assessment & Plan:   Active Problems: C.diff colitis - CT abd showed nonspecific colitis involving transverse, descending and sigmoid segments.  - Stool for C.diff positive  - PO vanco for 9 more days on discharge   Bilateral hydronephrosis - CT scan showed progressive hydronephrosis right greater than left, and right ureterectasis since previous PET-CT of 10/14/2016.  - Refused HD  Acute kidney injury superimposed on CKD stage 3 / Metabolic acidosis  - Cr 8.09 - Cr slightly down, 4.33 - Continue phoslo, sodium bicarb   Hypokalemia - Due to diarrhea  - Continue to supplement   Anemia of CKD - Hgb stable   Dyslipidemia - Continue Pravachol   DVT prophylaxis: Hep subQ Code Status: full code  Family Communication: no family at bedside    Consultants:   None   Procedures:   None   Antimicrobials:   Vanco PO 2/3 -->     Signed:  Leisa Lenz, MD  [facility] 04/12/2017, 9:42 AM  Pager #: [phone]  Time spent in minutes: more than 30 minutes  Discharge Exam: Vitals:   04/11/17 2124 04/12/17 0505  BP: 123/69 (!) 152/66  Pulse: 66 (!) 59  Resp: 14 16  Temp: 98 F (36.7 C) 98.3 F (36.8 C)  SpO2: 100% 99%   Vitals:   04/11/17 0443  04/11/17 1300 04/11/17 2124 04/12/17 0505  BP: (!) 132/59 128/62 123/69 (!) 152/66  Pulse: 80 76 66 (!) 59  Resp: 14 14 14 16   Temp: 98.2 F (36.8 C) 98.4 F (36.9 C) 98 F (36.7 C) 98.3 F (36.8 C)  TempSrc: Oral Oral Oral Oral  SpO2: 100% 100% 100% 99%  Weight:      Height:        General: Pt is alert, follows commands appropriately, not in acute distress Cardiovascular: Regular rate and rhythm, S1/S2 + Respiratory: no wheezing, no crackles, no rhonchi Abdominal: (+) BS, distended but not tender  Extremities: no edema, no cyanosis, pulses palpable bilaterally DP and PT Neuro: Grossly nonfocal  Discharge Instructions  Discharge Instructions    Call MD for:  persistant nausea and vomiting   Complete by:  As directed    Call MD for:  redness, tenderness, or signs of infection (pain, swelling, redness, odor or green/yellow discharge around incision site)   Complete by:  As directed    Call MD for:  severe uncontrolled pain   Complete by:  As directed    Diet - low sodium heart healthy   Complete by:  As directed    Discharge instructions   Complete by:  As directed    Continue vanco PO for 9 more days on discharge Patient refused hemodialysis   Increase activity slowly   Complete by:  As directed      Allergies as of 04/12/2017   No Known Allergies  Medication List    STOP taking these medications   diphenoxylate-atropine 2.5-0.025 MG tablet Commonly known as:  LOMOTIL   docusate sodium 100 MG capsule Commonly known as:  COLACE   phenazopyridine 200 MG tablet Commonly known as:  PYRIDIUM   polyethylene glycol powder powder Commonly known as:  GLYCOLAX/MIRALAX   traMADol-acetaminophen 37.5-325 MG tablet Commonly known as:  ULTRACET   trimethoprim 100 MG tablet Commonly known as:  TRIMPEX     TAKE these medications   calcium acetate 667 MG capsule Commonly known as:  PHOSLO Take by mouth 3 (three) times daily.   FEROSUL 325 (65 FE) MG  tablet Generic drug:  ferrous sulfate Take 325 mg by mouth 2 (two) times daily.   finasteride 5 MG tablet Commonly known as:  PROSCAR Take 5 mg by mouth daily.   furosemide 80 MG tablet Commonly known as:  LASIX Take 80 mg by mouth 3 (three) times daily.   lovastatin 20 MG tablet Commonly known as:  MEVACOR Take 20 mg by mouth at bedtime.   potassium chloride 20 MEQ/15ML (10%) Soln Take 20 mEq by mouth 2 (two) times daily.   sodium bicarbonate 650 MG tablet Take 2 tablets by mouth 2 (two) times daily.   tamsulosin 0.4 MG Caps capsule Commonly known as:  FLOMAX Take 0.4 mg by mouth daily.   triamcinolone cream 0.5 % Commonly known as:  KENALOG Apply 1 application topically 2 (two) times daily.   vancomycin 50 mg/mL oral solution Commonly known as:  VANCOCIN Take 2.5 mLs (125 mg total) by mouth 4 (four) times daily for 9 days.         The results of significant diagnostics from this hospitalization (including imaging, microbiology, ancillary and laboratory) are listed below for reference.    Significant Diagnostic Studies: Ct Abdomen Pelvis Wo Contrast  Result Date: 04/09/2017 CLINICAL DATA:  Diverticulitis EXAM: CT ABDOMEN AND PELVIS WITHOUT CONTRAST TECHNIQUE: Multidetector CT imaging of the abdomen and pelvis was performed following the standard protocol without IV contrast. COMPARISON:  PET-CT 10/14/2016 FINDINGS: Lower chest: Atheromatous aortic calcifications. No pleural or pericardial effusion. Subpleural scarring or atelectasis posteriorly in the visualized lower lobes. Hepatobiliary: No focal liver abnormality is seen. No gallstones, gallbladder wall thickening, or biliary dilatation. Pancreas: Diffuse atrophy without ductal dilatation or mass. Spleen: Normal in size without focal abnormality. Adrenals/Urinary Tract: Normal adrenals. Mild hydronephrosis, increased on the right since previous exam. New right ureterectasis. Urinary bladder is physiologically distended.  Stomach/Bowel: Stomach is nondistended. Small bowel is nondilated. There is a loop of distal ileum in left inguinal hernia without suggestion of obstruction or strangulation. Normal appendix. The colon is nondilated with suggestion of wall thickening in the proximal transverse segment and more conspicuously circumferentially in the distal descending and sigmoid segments, with adjacent mild inflammatory/edematous changes. No significant diverticular disease. Vascular/Lymphatic: Extensive aortic atheromatous plaque involving visceral and iliac arteries as well, without aneurysm. Ectatic infrarenal segment with stable partially calcified eccentric mural thrombus. Reproductive: Marked prostatic enlargement. Subcentimeter left para-aortic lymph nodes. No pelvic adenopathy. Other: No ascites.  No free air. Musculoskeletal: Left inguinal hernia containing a loop of small bowel without evidence of obstruction or strangulation. Spondylitic changes in the lower lumbar spine. No fracture or worrisome bone lesion. IMPRESSION: 1. Nonspecific colitis involving transverse, descending and sigmoid segments. 2. Progressive hydronephrosis right greater than left, and right ureterectasis since previous PET-CT of 10/14/2016. 3. Left inguinal hernia involving a loop of ileum, without obstruction or strangulation. 4. Aortic Atherosclerosis (ICD10-170.0).  Ectatic abdominal aorta at risk for aneurysm development. Recommend followup by ultrasound in 5 years. This recommendation follows ACR consensus guidelines: White Paper of the ACR Incidental Findings Committee II on Vascular Findings. J Am Coll Radiol 2013; 10:789-794. Electronically Signed   By: Lucrezia Europe M.D.   On: 04/09/2017 11:35    Microbiology: Recent Results (from the past 240 hour(s))  Gastrointestinal Panel by PCR , Stool     Status: None   Collection Time: 04/09/17  2:03 PM  Result Value Ref Range Status   Campylobacter species NOT DETECTED NOT DETECTED Final    Plesimonas shigelloides NOT DETECTED NOT DETECTED Final   Salmonella species NOT DETECTED NOT DETECTED Final   Yersinia enterocolitica NOT DETECTED NOT DETECTED Final   Vibrio species NOT DETECTED NOT DETECTED Final   Vibrio cholerae NOT DETECTED NOT DETECTED Final   Enteroaggregative E coli (EAEC) NOT DETECTED NOT DETECTED Final   Enteropathogenic E coli (EPEC) NOT DETECTED NOT DETECTED Final   Enterotoxigenic E coli (ETEC) NOT DETECTED NOT DETECTED Final   Shiga like toxin producing E coli (STEC) NOT DETECTED NOT DETECTED Final   Shigella/Enteroinvasive E coli (EIEC) NOT DETECTED NOT DETECTED Final   Cryptosporidium NOT DETECTED NOT DETECTED Final   Cyclospora cayetanensis NOT DETECTED NOT DETECTED Final   Entamoeba histolytica NOT DETECTED NOT DETECTED Final   Giardia lamblia NOT DETECTED NOT DETECTED Final   Adenovirus F40/41 NOT DETECTED NOT DETECTED Final   Astrovirus NOT DETECTED NOT DETECTED Final   Norovirus GI/GII NOT DETECTED NOT DETECTED Final   Rotavirus A NOT DETECTED NOT DETECTED Final   Sapovirus (I, II, IV, and V) NOT DETECTED NOT DETECTED Final    Comment: Performed at [facility], [address] [place], [place] [address]  C difficile quick scan w PCR reflex     Status: Abnormal   Collection Time: 04/09/17  2:03 PM  Result Value Ref Range Status   C Diff antigen POSITIVE (A) NEGATIVE Final   C Diff toxin POSITIVE (A) NEGATIVE Final   C Diff interpretation Toxin producing C. difficile detected.  Final    Comment: CRITICAL RESULT CALLED TO, READ BACK BY AND VERIFIED WITH: P.DOWD RN 04/09/2016 1641 JR Performed at [facility], [place] [address] [place], [place] [address]      Labs: Basic Metabolic Panel: Recent Labs  Lab 04/09/17 1002 04/10/17 0345 04/11/17 0348 04/12/17 0406  NA 136 138 136 139  K 2.8* 2.6* 3.0* 3.2*  CL 106 110 113* 114*  CO2 20* 18* 16* 18*  GLUCOSE 112* 116* 126* 105*  BUN 65* 67* 59* 59*  CREATININE  4.66* 4.53* 4.33* 4.24*  CALCIUM 7.4* 6.9* 6.6* 7.0*   Liver Function Tests: Recent Labs  Lab 04/09/17 1002  AST 33  ALT 15*  ALKPHOS 64  BILITOT 0.7  PROT 4.5*  ALBUMIN 1.7*   Recent Labs  Lab 04/09/17 1002  LIPASE 19   No results for input(s): AMMONIA in the last 168 hours. CBC: Recent Labs  Lab 04/09/17 1002 04/11/17 0348 04/12/17 0406  WBC 13.1* 10.5 7.8  HGB 11.0* 10.2* 11.0*  HCT 33.2* 29.6* 33.8*  MCV 88.5 88.1 89.4  PLT 190 185 202   Cardiac Enzymes: No results for input(s): CKTOTAL, CKMB, CKMBINDEX, TROPONINI in the last 168 hours. BNP: BNP (last 3 results) No results for input(s): BNP in the last 8760 hours.  ProBNP (last 3 results) No results for input(s): PROBNP in the last 8760 hours.  CBG:  No results for input(s): GLUCAP in the last 168 hours.

## 2017-04-13 ENCOUNTER — Encounter: Payer: Self-pay | Admitting: Adult Health

## 2017-04-13 ENCOUNTER — Non-Acute Institutional Stay (SKILLED_NURSING_FACILITY): Payer: Medicare Other | Admitting: Adult Health

## 2017-04-13 DIAGNOSIS — E876 Hypokalemia: Secondary | ICD-10-CM

## 2017-04-13 DIAGNOSIS — N186 End stage renal disease: Secondary | ICD-10-CM

## 2017-04-13 DIAGNOSIS — Z8546 Personal history of malignant neoplasm of prostate: Secondary | ICD-10-CM | POA: Diagnosis not present

## 2017-04-13 DIAGNOSIS — A0472 Enterocolitis due to Clostridium difficile, not specified as recurrent: Secondary | ICD-10-CM

## 2017-04-13 DIAGNOSIS — D638 Anemia in other chronic diseases classified elsewhere: Secondary | ICD-10-CM | POA: Diagnosis not present

## 2017-04-13 DIAGNOSIS — E785 Hyperlipidemia, unspecified: Secondary | ICD-10-CM

## 2017-04-13 DIAGNOSIS — R5381 Other malaise: Secondary | ICD-10-CM

## 2017-04-13 NOTE — Progress Notes (Signed)
Location:  [place] Room Number: 308-A Place of Service:  SNF (31) Provider:  Durenda Age, NP  Patient Care Team: System, Pcp Not In as PCP - General (Urology)  Extended Emergency Contact Information Primary Emergency Contact: Ruz,Jose  [place] Phone: [phone] Relation: Son  Code Status:  Full Code  Goals of care: Advanced Directive information Advanced Directives 04/09/2017  Does Patient Have a Medical Advance Directive? No  Would patient like information on creating a medical advance directive? No - Patient declined     Chief Complaint  Patient presents with  . Acute Visit    Follow up of [facility] hospitalization 2/2-04/12/17 for diarrhea    HPI:  Pt is an 84 y.o. male seen today for medical management of chronic diseases. He was hospitalized at [facility] 2/2-04/12/17 for diarrhea, dehydration, and a fall.  He was found to be positive for C. Difficile. CT scan showed nonspecific colitis involving transverse, descending and sigmoid segments. He was discharged on Vancomycin for 9 more days. CT scan, also, revealed progressive hydronephrosis right greater than left and right ureterectasis since previous PET-CT of 10/14/16. He refused HD. He was admitted to [place] on 04/12/17 for short-term rehabilitation.  He has a PMH of prostate cancer, CKD stage II, and hypertension. He was seen in the room today.   Past Medical History:  Diagnosis Date  . Cancer [facility]) 2017   prostate  . CKD (chronic kidney disease), stage III ([place])   . Clostridium difficile colitis 04/2017  . Hypertension    Past Surgical History:  Procedure Laterality Date  . CRYOABLATION N/A 06/09/2015   Procedure: CRYO ABLATION PROSTATE;  Surgeon: Carolan Clines, MD;  Location: [facility];  Service: Urology;  Laterality: N/A;  . orif arm Right 1963   accident w/surgical repair-rt arm    No Known Allergies  Outpatient  Encounter Medications as of 04/13/2017  Medication Sig  . calcium acetate (PHOSLO) 667 MG capsule Take by mouth 3 (three) times daily.  . FEROSUL 325 (65 Fe) MG tablet Take 325 mg by mouth 2 (two) times daily.  . finasteride (PROSCAR) 5 MG tablet Take 5 mg by mouth daily.  . furosemide (LASIX) 80 MG tablet Take 80 mg by mouth 3 (three) times daily.  Marland Kitchen lovastatin (MEVACOR) 20 MG tablet Take 20 mg by mouth at bedtime.  . potassium chloride 20 MEQ/15ML (10%) SOLN Take 20 mEq by mouth 2 (two) times daily.  . sodium bicarbonate 650 MG tablet Take 2 tablets by mouth 2 (two) times daily.  . tamsulosin (FLOMAX) 0.4 MG CAPS capsule Take 0.4 mg by mouth daily.  Marland Kitchen triamcinolone cream (KENALOG) 0.5 % Apply 1 application topically 2 (two) times daily.   . vancomycin (VANCOCIN) 50 mg/mL oral solution Take 2.5 mLs (125 mg total) by mouth 4 (four) times daily for 9 days.   No facility-administered encounter medications on file as of 04/13/2017.     Review of Systems  GENERAL: No change in appetite, no fatigue, no weight changes, no fever, chills or weakness MOUTH and THROAT: Denies oral discomfort, gingival pain or bleeding, pain from teeth or hoarseness   RESPIRATORY: no cough, SOB, DOE, wheezing, hemoptysis CARDIAC: No chest pain, edema or palpitations GI: +diarrhea X 3 PSYCHIATRIC: Denies feelings of depression or anxiety. No report of hallucinations, insomnia, paranoia, or agitation    Immunization History  Administered Date(s) Administered  . Influenza-Unspecified 12/06/2013   Pertinent  Health Maintenance Due  Topic Date Due  . PNA vac Low Risk Adult (1 of 2 - PCV13) 04/19/1998  . INFLUENZA VACCINE  10/06/2016      Vitals:   04/13/17 1039  BP: (!) 150/76  Pulse: 68  Resp: 17  Temp: (!) 97.1 F (36.2 C)  TempSrc: Oral  SpO2: 98%  Weight: 161 lb 13.1 oz (73.4 kg)  Height: 5\' 6"  (1.676 m)   Body mass index is 26.12 kg/m.  Physical Exam  GENERAL APPEARANCE: Well nourished. In no  acute distress. Normal body habitus SKIN:  Skin is warm and dry.  MOUTH and THROAT: Lips are without lesions. Tongue with whitish coating RESPIRATORY: Breathing is even & unlabored, BS CTAB CARDIAC: RRR, no murmur,no extra heart sounds, no edema GI: Abdomen soft, normal BS, no masses, no tenderness, no hepatomegaly, no splenomegaly EXTREMITIES: Able to move X 4 extremities PSYCHIATRIC: Alert and oriented X 3. Affect and behavior are appropriate   Labs reviewed: Recent Labs    04/10/17 0345 04/11/17 0348 04/12/17 0406  NA 138 136 139  K 2.6* 3.0* 3.2*  CL 110 113* 114*  CO2 18* 16* 18*  GLUCOSE 116* 126* 105*  BUN 67* 59* 59*  CREATININE 4.53* 4.33* 4.24*  CALCIUM 6.9* 6.6* 7.0*   Recent Labs    04/09/17 1002  AST 33  ALT 15*  ALKPHOS 64  BILITOT 0.7  PROT 4.5*  ALBUMIN 1.7*   Recent Labs    04/09/17 1002 04/11/17 0348 04/12/17 0406  WBC 13.1* 10.5 7.8  HGB 11.0* 10.2* 11.0*  HCT 33.2* 29.6* 33.8*  MCV 88.5 88.1 89.4  PLT 190 185 202   Significant Diagnostic Results in last 30 days:  Ct Abdomen Pelvis Wo Contrast  Result Date: 04/09/2017 CLINICAL DATA:  Diverticulitis EXAM: CT ABDOMEN AND PELVIS WITHOUT CONTRAST TECHNIQUE: Multidetector CT imaging of the abdomen and pelvis was performed following the standard protocol without IV contrast. COMPARISON:  PET-CT 10/14/2016 FINDINGS: Lower chest: Atheromatous aortic calcifications. No pleural or pericardial effusion. Subpleural scarring or atelectasis posteriorly in the visualized lower lobes. Hepatobiliary: No focal liver abnormality is seen. No gallstones, gallbladder wall thickening, or biliary dilatation. Pancreas: Diffuse atrophy without ductal dilatation or mass. Spleen: Normal in size without focal abnormality. Adrenals/Urinary Tract: Normal adrenals. Mild hydronephrosis, increased on the right since previous exam. New right ureterectasis. Urinary bladder is physiologically distended. Stomach/Bowel: Stomach is  nondistended. Small bowel is nondilated. There is a loop of distal ileum in left inguinal hernia without suggestion of obstruction or strangulation. Normal appendix. The colon is nondilated with suggestion of wall thickening in the proximal transverse segment and more conspicuously circumferentially in the distal descending and sigmoid segments, with adjacent mild inflammatory/edematous changes. No significant diverticular disease. Vascular/Lymphatic: Extensive aortic atheromatous plaque involving visceral and iliac arteries as well, without aneurysm. Ectatic infrarenal segment with stable partially calcified eccentric mural thrombus. Reproductive: Marked prostatic enlargement. Subcentimeter left para-aortic lymph nodes. No pelvic adenopathy. Other: No ascites.  No free air. Musculoskeletal: Left inguinal hernia containing a loop of small bowel without evidence of obstruction or strangulation. Spondylitic changes in the lower lumbar spine. No fracture or worrisome bone lesion. IMPRESSION: 1. Nonspecific colitis involving transverse, descending and sigmoid segments. 2. Progressive hydronephrosis right greater than left, and right ureterectasis since previous PET-CT of 10/14/2016. 3. Left inguinal hernia involving a loop of ileum, without obstruction or strangulation. 4. Aortic Atherosclerosis (ICD10-170.0). Ectatic abdominal aorta at risk for aneurysm development. Recommend followup by ultrasound in 5 years. This recommendation follows ACR consensus  guidelines: White Paper of the ACR Incidental Findings Committee II on Vascular Findings. J Am Coll Radiol 2013; 10:789-794. Electronically Signed   By: Lucrezia Europe M.D.   On: 04/09/2017 11:35    Assessment/Plan   1. Physical deconditioning  - for rehabilitation, PT and OT, for therapeutic strengthening exercises, fall precautions   2. Clostridium difficile colitis - continue Vancomycin 125 mg QID (stop date 04/21/17)   3. Anemia of chronic disease - continue  FeSO4 325 mg BID Lab Results  Component Value Date   HGB 11.0 (L) 04/12/2017     4. History of prostate cancer - continue Finasteride 5 mg daily and Tamsulosin 0.4 mg daily   5. Dyslipidemia - continue Lovastatin 20 mg 1 tab Q HS   6. End stage renal disease ([place]) -  CT scan revealed progressive hydronephrosis right greater than left and right ureterectasis since previous PET-CT of 10/14/16. He refused HD.   7. Hypokalemia - continue KCL 10% 20 meq/15 ml BID    Family/ staff Communication: Discussed plan of care with patient.   Labs/tests ordered:  None  Goals of care:   Short-term rehabilitation   Durenda Age, NP [facility] and Adult Medicine [phone] (Monday-Friday 8:00 a.m. - 5:00 p.m.) [phone] (after hours)

## 2017-04-14 ENCOUNTER — Encounter: Payer: Self-pay | Admitting: Internal Medicine

## 2017-04-14 ENCOUNTER — Non-Acute Institutional Stay (SKILLED_NURSING_FACILITY): Payer: Medicare Other | Admitting: Internal Medicine

## 2017-04-14 DIAGNOSIS — Z8612 Personal history of poliomyelitis: Secondary | ICD-10-CM | POA: Insufficient documentation

## 2017-04-14 DIAGNOSIS — N186 End stage renal disease: Secondary | ICD-10-CM | POA: Diagnosis not present

## 2017-04-14 DIAGNOSIS — D638 Anemia in other chronic diseases classified elsewhere: Secondary | ICD-10-CM | POA: Diagnosis not present

## 2017-04-14 DIAGNOSIS — Z8546 Personal history of malignant neoplasm of prostate: Secondary | ICD-10-CM | POA: Insufficient documentation

## 2017-04-14 DIAGNOSIS — K529 Noninfective gastroenteritis and colitis, unspecified: Secondary | ICD-10-CM

## 2017-04-14 DIAGNOSIS — B370 Candidal stomatitis: Secondary | ICD-10-CM

## 2017-04-14 DIAGNOSIS — I10 Essential (primary) hypertension: Secondary | ICD-10-CM

## 2017-04-14 DIAGNOSIS — E785 Hyperlipidemia, unspecified: Secondary | ICD-10-CM | POA: Insufficient documentation

## 2017-04-14 NOTE — Patient Instructions (Signed)
See assessment and plan under each diagnosis in the problem list and acutely for this visit 

## 2017-04-14 NOTE — Assessment & Plan Note (Signed)
Vancomycin until 04/21/17

## 2017-04-14 NOTE — Assessment & Plan Note (Signed)
Blood pressure on recheck was 118/64 BP controlled; no change in antihypertensive medications

## 2017-04-14 NOTE — Progress Notes (Signed)
NURSING HOME LOCATION:  [facility] ROOM NUMBER:  308-A  CODE STATUS:  Full Code  PCP:  System, Pcp Not In    This is a comprehensive admission note to [facility] performed on this date less than 30 days from date of admission. Included are preadmission medical/surgical history;reconciled medication list; family history; social history and comprehensive review of systems.  Corrections and additions to the records were documented . Comprehensive physical exam was also performed. Additionally a clinical summary was entered for each active diagnosis pertinent to this admission in the Problem List to enhance continuity of care.  HPI: The patient was hospitalized 2/2-04/12/17 presenting with progressive generalized weakness and fatigue. He also had progressive diarrhea over 5 days. CT of the abdomen revealed colitis involving the transverse/descending/and sigmoid segments. Stool was positive for C. difficile. Vancomycin was initiated and was to be continued for 9 additional days following discharge. CT scan showed progressive hydronephrosis of both kidneys, right greater than left was also right ureterectasis which is new compared to PET-CT of 10/14/16. Dialysis was recommended but declined. He exhibited acute kidney injury superimposed on CKD stage III associated with metabolic acidosis. Creatinine was 4.53. He was hypokalemic due to his diarrhea. Supplementation was provided. At discharge potassium 3.2, glucose 105, BUN 59, creatinine 4.24l and GFR less than 15. He exhibited a normochromic, normocytic anemia with hemoglobin 11 and hematocrit 33.8  Past medical and surgical history: Includes dyslipidemia, hypertension, and prostate cancer. He also had a history of childhood polio. He has had cryoablation 2017 for the prostate cancer.  Social history:  He said he smoked a pack a day until 6 days ago.He has been a social drinker.  Family history: None on file. Mother had diabetes. He  actually has 2 children who live in [place], [place]. He has 3 sons in [place].No family in [place]  Review of systems:  His only complaint is "I'm tired of pooping." He is unable to delineate the number of stools. He also requests medicines for his mouth (infection) disease. He states "I just want to die. I just don't want to go through all this". He specifically did not want to arrange transportation 3 times a week for hemodialysis. He has no family support system here and no means of transportation. He denied depression. He then stated that his only problem was "no one will give me a million dollars".  Constitutional: No fever,significant weight change, fatigue  Eyes: No redness, discharge, pain, vision change ENT/mouth: No nasal congestion, purulent discharge, earache, change in hearing, sore throat  Cardiovascular: No chest pain, palpitations, paroxysmal nocturnal dyspnea, claudication, edema  Respiratory: No cough, sputum production, hemoptysis, DOE significant snoring, apnea   Gastrointestinal: No heartburn, dysphagia, abdominal pain, nausea/vomiting, rectal bleeding, melena Genitourinary: No dysuria,hematuria, pyuria,  incontinence, nocturia Musculoskeletal: No joint stiffness, joint swelling, weakness,pain Dermatologic: No rash, pruritus, change in appearance of skin Neurologic: No dizziness, headache, syncope, seizures, numbness, tingling Psychiatric: No significant anxiety, depression, insomnia, anorexia Endocrine: No change in hair/skin/ nails, excessive thirst, excessive hunger, excessive urination  Hematologic/lymphatic: No significant bruising, lymphadenopathy, abnormal bleeding Allergy/immunology: No itchy/ watery eyes, significant sneezing, urticaria, angioedema  Physical exam:  Pertinent or positive findings: Clinically he exhibited dyspnea with minimal exertion yet denied dyspnea. Complete dentures present. Oral candidiasis suggested. Ptosis on the right. There is slight  exotropia on the right.Dry rales @ bases present. Respirations are shallow. There is respiratory variation to his heart rhythm. Abdomen is protuberant. Pulses are decreased but intact.  He has 1+ edema to the sock line. There is hyperpigmentation greater on the right lower extremity than left. There is flexion contraction of the fifth right finger. Clubbing of the nailbeds suggested.  General appearance: Adequately nourished; no acute distress, increased work of breathing is present.   Lymphatic: No lymphadenopathy about the head, neck, axilla . Eyes: No conjunctival inflammation or lid edema is present. There is no scleral icterus. Ears:  External ear exam shows no significant lesions or deformities.   Nose:  External nasal examination shows no deformity or inflammation. Nasal mucosa are pink and moist without lesions ,exudates Oral exam: Lips and gums are healthy appearing.  Neck:  No thyromegaly, masses, tenderness noted.    Heart:  No gallop, murmur, click, rub .  Lungs: without wheezes, rhonchi , rubs. Abdomen:Bowel sounds are normal. Abdomen is soft and nontender with no organomegaly, hernias,masses. GU: Deferred  Extremities:  No cyanosis Neurologic exam:  Strength equal in upper & lower extremities.  Balance, Rhomberg, finger to nose testing could not be completed due to clinical state Deep tendon reflexes are equal Skin: Warm & dry w/o tenting. No significant lesions or rash.  See clinical summary under each active problem in the Problem List with associated updated therapeutic plan

## 2017-04-14 NOTE — Assessment & Plan Note (Signed)
Monitor for any progression clinically

## 2017-04-28 ENCOUNTER — Encounter (HOSPITAL_COMMUNITY): Payer: Self-pay | Admitting: Emergency Medicine

## 2017-04-28 ENCOUNTER — Other Ambulatory Visit: Payer: Self-pay

## 2017-04-28 ENCOUNTER — Inpatient Hospital Stay (HOSPITAL_COMMUNITY): Payer: Medicare Other

## 2017-04-28 ENCOUNTER — Inpatient Hospital Stay (HOSPITAL_COMMUNITY)
Admission: EM | Admit: 2017-04-28 | Discharge: 2017-05-02 | DRG: 871 | Disposition: A | Discharge status: Skilled Nursing Facility | Payer: Medicare Other | Attending: Internal Medicine | Admitting: Internal Medicine

## 2017-04-28 DIAGNOSIS — A0471 Enterocolitis due to Clostridium difficile, recurrent: Secondary | ICD-10-CM | POA: Diagnosis present

## 2017-04-28 DIAGNOSIS — I10 Essential (primary) hypertension: Secondary | ICD-10-CM | POA: Diagnosis present

## 2017-04-28 DIAGNOSIS — I480 Paroxysmal atrial fibrillation: Secondary | ICD-10-CM | POA: Diagnosis present

## 2017-04-28 DIAGNOSIS — A0472 Enterocolitis due to Clostridium difficile, not specified as recurrent: Secondary | ICD-10-CM | POA: Diagnosis not present

## 2017-04-28 DIAGNOSIS — I120 Hypertensive chronic kidney disease with stage 5 chronic kidney disease or end stage renal disease: Secondary | ICD-10-CM | POA: Diagnosis present

## 2017-04-28 DIAGNOSIS — A419 Sepsis, unspecified organism: Secondary | ICD-10-CM | POA: Diagnosis present

## 2017-04-28 DIAGNOSIS — Z9181 History of falling: Secondary | ICD-10-CM | POA: Diagnosis not present

## 2017-04-28 DIAGNOSIS — A414 Sepsis due to anaerobes: Principal | ICD-10-CM | POA: Diagnosis present

## 2017-04-28 DIAGNOSIS — N186 End stage renal disease: Secondary | ICD-10-CM | POA: Diagnosis present

## 2017-04-28 DIAGNOSIS — E785 Hyperlipidemia, unspecified: Secondary | ICD-10-CM | POA: Diagnosis present

## 2017-04-28 DIAGNOSIS — G4700 Insomnia, unspecified: Secondary | ICD-10-CM | POA: Diagnosis present

## 2017-04-28 DIAGNOSIS — I471 Supraventricular tachycardia: Secondary | ICD-10-CM | POA: Diagnosis present

## 2017-04-28 DIAGNOSIS — Z720 Tobacco use: Secondary | ICD-10-CM | POA: Diagnosis not present

## 2017-04-28 DIAGNOSIS — R197 Diarrhea, unspecified: Secondary | ICD-10-CM | POA: Diagnosis not present

## 2017-04-28 DIAGNOSIS — R45851 Suicidal ideations: Secondary | ICD-10-CM | POA: Diagnosis present

## 2017-04-28 DIAGNOSIS — K4090 Unilateral inguinal hernia, without obstruction or gangrene, not specified as recurrent: Secondary | ICD-10-CM | POA: Diagnosis present

## 2017-04-28 DIAGNOSIS — Z79899 Other long term (current) drug therapy: Secondary | ICD-10-CM

## 2017-04-28 DIAGNOSIS — Z7189 Other specified counseling: Secondary | ICD-10-CM | POA: Diagnosis not present

## 2017-04-28 DIAGNOSIS — E876 Hypokalemia: Secondary | ICD-10-CM | POA: Diagnosis present

## 2017-04-28 DIAGNOSIS — F4321 Adjustment disorder with depressed mood: Secondary | ICD-10-CM | POA: Diagnosis present

## 2017-04-28 DIAGNOSIS — D631 Anemia in chronic kidney disease: Secondary | ICD-10-CM | POA: Diagnosis present

## 2017-04-28 DIAGNOSIS — E869 Volume depletion, unspecified: Secondary | ICD-10-CM | POA: Diagnosis present

## 2017-04-28 DIAGNOSIS — Z8612 Personal history of poliomyelitis: Secondary | ICD-10-CM | POA: Diagnosis not present

## 2017-04-28 DIAGNOSIS — R000 Tachycardia, unspecified: Secondary | ICD-10-CM

## 2017-04-28 DIAGNOSIS — N400 Enlarged prostate without lower urinary tract symptoms: Secondary | ICD-10-CM | POA: Diagnosis present

## 2017-04-28 DIAGNOSIS — F17210 Nicotine dependence, cigarettes, uncomplicated: Secondary | ICD-10-CM | POA: Diagnosis present

## 2017-04-28 DIAGNOSIS — R531 Weakness: Secondary | ICD-10-CM | POA: Diagnosis not present

## 2017-04-28 DIAGNOSIS — Z66 Do not resuscitate: Secondary | ICD-10-CM | POA: Diagnosis present

## 2017-04-28 DIAGNOSIS — E440 Moderate protein-calorie malnutrition: Secondary | ICD-10-CM

## 2017-04-28 DIAGNOSIS — Z9115 Patient's noncompliance with renal dialysis: Secondary | ICD-10-CM

## 2017-04-28 DIAGNOSIS — N131 Hydronephrosis with ureteral stricture, not elsewhere classified: Secondary | ICD-10-CM | POA: Diagnosis present

## 2017-04-28 DIAGNOSIS — R55 Syncope and collapse: Secondary | ICD-10-CM | POA: Diagnosis not present

## 2017-04-28 LAB — I-STAT CHEM 8, ED
BUN: 35 mg/dL — ABNORMAL HIGH (ref 6–20)
CALCIUM ION: 1.1 mmol/L — AB (ref 1.15–1.40)
CREATININE: 4.1 mg/dL — AB (ref 0.61–1.24)
Chloride: 112 mmol/L — ABNORMAL HIGH (ref 101–111)
GLUCOSE: 98 mg/dL (ref 65–99)
HCT: 35 % — ABNORMAL LOW (ref 39.0–52.0)
Hemoglobin: 11.9 g/dL — ABNORMAL LOW (ref 13.0–17.0)
Potassium: 4.9 mmol/L (ref 3.5–5.1)
Sodium: 142 mmol/L (ref 135–145)
TCO2: 19 mmol/L — AB (ref 22–32)

## 2017-04-28 LAB — I-STAT CG4 LACTIC ACID, ED
LACTIC ACID, VENOUS: 2.01 mmol/L — AB (ref 0.5–1.9)
Lactic Acid, Venous: 0.99 mmol/L (ref 0.5–1.9)

## 2017-04-28 LAB — BASIC METABOLIC PANEL
Anion gap: 12 (ref 5–15)
BUN: 42 mg/dL — AB (ref 6–20)
CALCIUM: 8.2 mg/dL — AB (ref 8.9–10.3)
CHLORIDE: 113 mmol/L — AB (ref 101–111)
CO2: 17 mmol/L — AB (ref 22–32)
CREATININE: 4.11 mg/dL — AB (ref 0.61–1.24)
GFR calc Af Amer: 14 mL/min — ABNORMAL LOW (ref >60)
GFR calc non Af Amer: 12 mL/min — ABNORMAL LOW (ref >60)
GLUCOSE: 101 mg/dL — AB (ref 65–99)
Potassium: 5 mmol/L (ref 3.5–5.1)
Sodium: 142 mmol/L (ref 135–145)

## 2017-04-28 LAB — I-STAT TROPONIN, ED: Troponin i, poc: 0.04 ng/mL (ref 0.00–0.08)

## 2017-04-28 LAB — CBC WITH DIFFERENTIAL/PLATELET
Basophils Absolute: 0 10*3/uL (ref 0.0–0.1)
Basophils Relative: 0 %
EOS PCT: 0 %
Eosinophils Absolute: 0 10*3/uL (ref 0.0–0.7)
HCT: 31.1 % — ABNORMAL LOW (ref 39.0–52.0)
HEMOGLOBIN: 10.1 g/dL — AB (ref 13.0–17.0)
LYMPHS PCT: 13 %
Lymphs Abs: 1.4 10*3/uL (ref 0.7–4.0)
MCH: 30.4 pg (ref 26.0–34.0)
MCHC: 32.5 g/dL (ref 30.0–36.0)
MCV: 93.7 fL (ref 78.0–100.0)
MONO ABS: 0.6 10*3/uL (ref 0.1–1.0)
MONOS PCT: 6 %
NEUTROS PCT: 81 %
Neutro Abs: 8.7 10*3/uL — ABNORMAL HIGH (ref 1.7–7.7)
PLATELETS: 174 10*3/uL (ref 150–400)
RBC: 3.32 MIL/uL — AB (ref 4.22–5.81)
RDW: 17.5 % — ABNORMAL HIGH (ref 11.5–15.5)
WBC MORPHOLOGY: INCREASED
WBC: 10.7 10*3/uL — ABNORMAL HIGH (ref 4.0–10.5)

## 2017-04-28 LAB — PROCALCITONIN: Procalcitonin: 3.85 ng/mL

## 2017-04-28 MED ORDER — FINASTERIDE 5 MG PO TABS
5.0000 mg | ORAL_TABLET | Freq: Every day | ORAL | Status: DC
  Administered 2017-04-29 – 2017-05-02 (×4): 5 mg via ORAL
  Filled 2017-04-28 (×5): qty 1

## 2017-04-28 MED ORDER — ONDANSETRON HCL 4 MG/2ML IJ SOLN
4.0000 mg | Freq: Four times a day (QID) | INTRAMUSCULAR | Status: DC | PRN
  Administered 2017-04-30: 4 mg via INTRAVENOUS
  Filled 2017-04-28: qty 2

## 2017-04-28 MED ORDER — TAMSULOSIN HCL 0.4 MG PO CAPS
0.4000 mg | ORAL_CAPSULE | Freq: Every day | ORAL | Status: DC
  Administered 2017-04-29 – 2017-05-02 (×4): 0.4 mg via ORAL
  Filled 2017-04-28 (×5): qty 1

## 2017-04-28 MED ORDER — NICOTINE 21 MG/24HR TD PT24
21.0000 mg | MEDICATED_PATCH | Freq: Every day | TRANSDERMAL | Status: DC
  Administered 2017-04-29 – 2017-05-01 (×4): 21 mg via TRANSDERMAL
  Filled 2017-04-28 (×4): qty 1

## 2017-04-28 MED ORDER — SODIUM BICARBONATE 650 MG PO TABS
1300.0000 mg | ORAL_TABLET | Freq: Two times a day (BID) | ORAL | Status: DC
  Administered 2017-04-29 – 2017-05-02 (×7): 1300 mg via ORAL
  Filled 2017-04-28 (×8): qty 2

## 2017-04-28 MED ORDER — VANCOMYCIN 50 MG/ML ORAL SOLUTION
125.0000 mg | Freq: Four times a day (QID) | ORAL | Status: DC
  Administered 2017-04-28: 125 mg via ORAL
  Filled 2017-04-28 (×3): qty 2.5

## 2017-04-28 MED ORDER — ZOLPIDEM TARTRATE 5 MG PO TABS
5.0000 mg | ORAL_TABLET | Freq: Every evening | ORAL | Status: DC | PRN
  Administered 2017-04-30: 5 mg via ORAL
  Filled 2017-04-28: qty 1

## 2017-04-28 MED ORDER — ADENOSINE 6 MG/2ML IV SOLN
INTRAVENOUS | Status: AC
  Filled 2017-04-28: qty 8

## 2017-04-28 MED ORDER — HEPARIN SODIUM (PORCINE) 5000 UNIT/ML IJ SOLN
5000.0000 [IU] | Freq: Three times a day (TID) | INTRAMUSCULAR | Status: DC
  Administered 2017-04-29 – 2017-05-02 (×10): 5000 [IU] via SUBCUTANEOUS
  Filled 2017-04-28 (×11): qty 1

## 2017-04-28 MED ORDER — ACETAMINOPHEN 325 MG PO TABS: 650.0000 mg | ORAL_TABLET | Freq: Four times a day (QID) | ORAL | Status: DC | PRN

## 2017-04-28 MED ORDER — FERROUS SULFATE 325 (65 FE) MG PO TABS
325.0000 mg | ORAL_TABLET | Freq: Two times a day (BID) | ORAL | Status: DC
  Administered 2017-04-29 – 2017-05-02 (×8): 325 mg via ORAL
  Filled 2017-04-28 (×8): qty 1

## 2017-04-28 MED ORDER — PRAVASTATIN SODIUM 40 MG PO TABS
20.0000 mg | ORAL_TABLET | Freq: Every day | ORAL | Status: DC
  Administered 2017-04-29 – 2017-05-02 (×4): 20 mg via ORAL
  Filled 2017-04-28 (×5): qty 1

## 2017-04-28 MED ORDER — VANCOMYCIN 50 MG/ML ORAL SOLUTION: 125.0000 mg | Freq: Every day | ORAL | Status: DC

## 2017-04-28 MED ORDER — ONDANSETRON HCL 4 MG PO TABS: 4.0000 mg | ORAL_TABLET | Freq: Four times a day (QID) | ORAL | Status: DC | PRN

## 2017-04-28 MED ORDER — VANCOMYCIN 50 MG/ML ORAL SOLUTION: 125.0000 mg | ORAL | Status: DC

## 2017-04-28 MED ORDER — SODIUM CHLORIDE 0.9 % IV SOLN
INTRAVENOUS | Status: DC
  Administered 2017-04-29 (×2): via INTRAVENOUS

## 2017-04-28 MED ORDER — VANCOMYCIN 50 MG/ML ORAL SOLUTION: 125.0000 mg | Freq: Two times a day (BID) | ORAL | Status: DC

## 2017-04-28 MED ORDER — TRIAMCINOLONE ACETONIDE 0.5 % EX CREA
1.0000 | TOPICAL_CREAM | Freq: Two times a day (BID) | CUTANEOUS | Status: DC
Start: 2017-04-29 — End: 2017-05-02
  Administered 2017-04-29 – 2017-05-01 (×6): 1 via TOPICAL
  Filled 2017-04-28: qty 15

## 2017-04-28 MED ORDER — LOPERAMIDE HCL 2 MG PO CAPS
4.0000 mg | ORAL_CAPSULE | ORAL | Status: DC | PRN
  Administered 2017-04-28: 4 mg via ORAL
  Filled 2017-04-28: qty 2

## 2017-04-28 MED ORDER — HYDRALAZINE HCL 20 MG/ML IJ SOLN: 5.0000 mg | INTRAMUSCULAR | Status: DC | PRN

## 2017-04-28 MED ORDER — ACETAMINOPHEN 650 MG RE SUPP: 650.0000 mg | Freq: Four times a day (QID) | RECTAL | Status: DC | PRN

## 2017-04-28 MED ORDER — CALCIUM ACETATE (PHOS BINDER) 667 MG PO CAPS
667.0000 mg | ORAL_CAPSULE | Freq: Three times a day (TID) | ORAL | Status: DC
  Administered 2017-04-29 – 2017-05-02 (×11): 667 mg via ORAL
  Filled 2017-04-28 (×11): qty 1

## 2017-04-28 MED ORDER — SODIUM CHLORIDE 0.9 % IV BOLUS (SEPSIS)
500.0000 mL | Freq: Once | INTRAVENOUS | Status: AC
  Administered 2017-04-28: 500 mL via INTRAVENOUS

## 2017-04-28 NOTE — ED Notes (Signed)
I stat results reported to Dr. Eulis Foster by B. Yolanda Bonine, EMT

## 2017-04-28 NOTE — ED Notes (Signed)
Dr.NIU at bedside. 

## 2017-04-28 NOTE — ED Triage Notes (Signed)
EMS reports pt was at [facility] he was d/c from [facility] yesterday. He c/o that "his legs are not working". Pulse of 160-190 svt on ekg. Denies SOB or Chest pain. A/O at triage. NAD.

## 2017-04-28 NOTE — Progress Notes (Signed)
Pharmacy Antibiotic Note  Marcus Lane is a 84 y.o. male admitted on 04/28/2017 with possible recurrent C. diff.  Pharmacy has been consulted for PO vanc dosing.  Patient was C. Diff positive earlier this month and is now having excessive diarrhea again. Seems to be first recurrence.  Plan: Start vancomycin taper F/U labs, improvement in symptoms  Height: 5\' 6"  (167.6 cm) Weight: 161 lb (73 kg) IBW/kg (Calculated) : 63.8  Temp (24hrs), Avg:100.2 F (37.9 C), Min:100.2 F (37.9 C), Max:100.2 F (37.9 C)  Recent Labs  Lab 04/28/17 1537 04/28/17 1606 04/28/17 1744  CREATININE 4.11* 4.10*  --   LATICACIDVEN  --  2.01* 0.99    Estimated Creatinine Clearance: 12.1 mL/min (A) (by C-G formula based on SCr of 4.1 mg/dL (H)).    No Known Allergies  Thank you for allowing pharmacy to be a part of this patient's care.  Reginia Naas 04/28/2017 10:02 PM

## 2017-04-28 NOTE — ED Notes (Signed)
ED Provider at bedside. 

## 2017-04-28 NOTE — ED Notes (Signed)
Pt has had episode of diarrhea

## 2017-04-28 NOTE — ED Notes (Signed)
zoll placed on patient. EDP at bedside,

## 2017-04-28 NOTE — ED Notes (Signed)
EDP made aware of patient.

## 2017-04-28 NOTE — ED Provider Notes (Signed)
[facility] EMERGENCY DEPARTMENT Provider Note   CSN: [id number] Arrival date & time: 04/28/17  1453     History   Chief Complaint Chief Complaint  Patient presents with  . Weakness  . Tachycardia    HPI Marcus Lane is a 84 y.o. male.  The patient was at [facility] when he was found sitting on the floor, because his legs were not working.  EMS arrived, and found him with SVT on cardiac monitor.  I transferred him here without any intervention.  On arrival he was not in SVT.  The patient is a somewhat poor historian.  He was reportedly at the drugstore to get some medicine for diarrhea.  He reports having brown loose stool, 6-8 times each day, for the last week.  He denies headache, chest pain, abdominal pain or back pain.  He was recently treated for C. difficile enterocolitis, while at rehab.  He had been discharged from the hospital 04/12/17, after treatment for colitis, with antibiotics.  His C. difficile test was positive prior to hospital discharge.  He was treated with oral vancomycin.  It was noted that he has previously been recommended for dialysis, but refused.  He was discharged from rehab, yesterday.  There are no other known modifying factors.  HPI  Past Medical History:  Diagnosis Date  . Cancer [facility]) 2017   prostate  . CKD (chronic kidney disease), stage III ([place])   . Clostridium difficile colitis 04/2017  . Hypertension   . Polio    As a child    Patient Active Problem List   Diagnosis Date Noted  . End stage renal disease ([place]) 04/14/2017  . History of poliomyelitis 04/14/2017  . Anemia of chronic disease 04/14/2017  . Essential hypertension 04/14/2017  . History of prostate cancer 04/14/2017  . Dyslipidemia 04/14/2017  . Colitis 04/09/2017  . Clostridium difficile colitis 04/08/2017    Past Surgical History:  Procedure Laterality Date  . CRYOABLATION N/A 06/09/2015   Procedure: CRYO ABLATION PROSTATE;  Surgeon: Carolan Clines, MD;   Location: [facility];  Service: Urology;  Laterality: N/A;  . orif arm Right 1963   accident w/surgical repair-rt arm       Home Medications    Prior to Admission medications   Medication Sig Start Date End Date Taking? Authorizing Provider  calcium acetate (PHOSLO) 667 MG capsule Take by mouth 3 (three) times daily. 03/15/17   [provider]  FEROSUL 325 (65 Fe) MG tablet Take 325 mg by mouth 2 (two) times daily. 03/23/17   [provider]  finasteride (PROSCAR) 5 MG tablet Take 5 mg by mouth daily. 03/15/17   [provider]  furosemide (LASIX) 80 MG tablet Take 80 mg by mouth 3 (three) times daily. 03/15/17   [provider]  lovastatin (MEVACOR) 20 MG tablet Take 20 mg by mouth at bedtime.    [provider]  potassium chloride 20 MEQ/15ML (10%) SOLN Take 20 mEq by mouth 2 (two) times daily. 04/01/17   [provider]  sodium bicarbonate 650 MG tablet Take 2 tablets by mouth 2 (two) times daily. 03/18/17   [provider]  tamsulosin (FLOMAX) 0.4 MG CAPS capsule Take 0.4 mg by mouth daily. 03/15/17   [provider]  triamcinolone cream (KENALOG) 0.5 % Apply 1 application topically 2 (two) times daily.  03/25/17   [provider]    Family History Family History  Problem Relation Age of Onset  . Diabetes  Mother     Social History Social History   Tobacco Use  . Smoking status: Light Tobacco Smoker    Packs/day: 0.25  . Smokeless tobacco: Never Used  Substance Use Topics  . Alcohol use: Yes    Alcohol/week: 4.8 oz    Types: 8 Cans of beer per week  . Drug use: No     Allergies   Patient has no known allergies.   Review of Systems Review of Systems  All other systems reviewed and are negative.    Physical Exam Updated Vital Signs BP (!) 163/80   Pulse 89   Temp 100.2 F (37.9 C) (Oral)   Resp (!) 23   Ht 5\' 6"  (1.676 m)   Wt 73 kg (161 lb)   SpO2 99%   BMI 25.99  kg/m   Physical Exam  Constitutional: He is oriented to person, place, and time. He appears well-developed. No distress.  Elderly, frail  HENT:  Head: Normocephalic and atraumatic.  Right Ear: External ear normal.  Left Ear: External ear normal.  Eyes: Conjunctivae and EOM are normal. Pupils are equal, round, and reactive to light.  Neck: Normal range of motion and phonation normal. Neck supple.  Cardiovascular: Normal rate, regular rhythm and normal heart sounds.  Pulmonary/Chest: Effort normal and breath sounds normal. No respiratory distress. He exhibits no bony tenderness.  Abdominal: Soft. There is no tenderness.  Musculoskeletal: Normal range of motion.  Neurological: He is alert and oriented to person, place, and time. No cranial nerve deficit or sensory deficit. He exhibits normal muscle tone. Coordination normal.  Skin: Skin is warm, dry and intact.  Psychiatric: He has a normal mood and affect. His behavior is normal.  Nursing note and vitals reviewed.    ED Treatments / Results  Labs (all labs ordered are listed, but only abnormal results are displayed) Labs Reviewed  BASIC METABOLIC PANEL - Abnormal; Notable for the following components:      Result Value   Chloride 113 (*)    CO2 17 (*)    Glucose, Bld 101 (*)    BUN 42 (*)    Creatinine, Ser 4.11 (*)    Calcium 8.2 (*)    GFR calc non Af Amer 12 (*)    GFR calc Af Amer 14 (*)    All other components within normal limits  I-STAT CHEM 8, ED - Abnormal; Notable for the following components:   Chloride 112 (*)    BUN 35 (*)    Creatinine, Ser 4.10 (*)    Calcium, Ion 1.10 (*)    TCO2 19 (*)    Hemoglobin 11.9 (*)    HCT 35.0 (*)    All other components within normal limits  I-STAT CG4 LACTIC ACID, ED - Abnormal; Notable for the following components:   Lactic Acid, Venous 2.01 (*)    All other components within normal limits  I-STAT TROPONIN, ED  I-STAT CG4 LACTIC ACID, ED   BUN  Date Value Ref Range  Status  04/28/2017 35 (H) 6 - 20 mg/dL Final  04/28/2017 42 (H) 6 - 20 mg/dL Final  04/12/2017 59 (H) 6 - 20 mg/dL Final  04/11/2017 59 (H) 6 - 20 mg/dL Final   Creatinine, Ser  Date Value Ref Range Status  04/28/2017 4.10 (H) 0.61 - 1.24 mg/dL Final  04/28/2017 4.11 (H) 0.61 - 1.24 mg/dL Final  04/12/2017 4.24 (H) 0.61 - 1.24 mg/dL Final  04/11/2017 4.33 (H) 0.61 - 1.24 mg/dL Final  EKG  EKG Interpretation None       Radiology No results found.  Procedures Procedures (including critical care time)  Medications Ordered in ED Medications  loperamide (IMODIUM) capsule 4 mg (not administered)  sodium chloride 0.9 % bolus 500 mL (0 mLs Intravenous Stopped 04/28/17 1721)     Initial Impression / Assessment and Plan / ED Course  I have reviewed the triage vital signs and the nursing notes.  Pertinent labs & imaging results that were available during my care of the patient were reviewed by me and considered in my medical decision making (see chart for details).  Clinical Course as of Apr 28 2050  Thu Apr 28, 2017  1501 Patient with marked tachycardia, ? SVT, recent C. Diff diarrhea. Will give IVF, and send labs and reassess. He is alert and comfortable.  [EW]  1550 The patient had a bout of rapid tacky arrhythmias, characterized by narrow complex, rates roughly 175/min.  Cardiac Monitor Lasted 15-20 Seconds.  He Also Had Some Different Arrhythmias, Which Were PVC Pairs and Singly.  [EW]    Clinical Course User Index [EW] Daleen Bo, MD     Patient Vitals for the past 24 hrs:  BP Temp Temp src Pulse Resp SpO2 Height Weight  04/28/17 2030 (!) 120/59 - - 68 (!) 30 96 % - -  04/28/17 1915 (!) 149/77 - - 81 (!) 23 97 % - -  04/28/17 1900 (!) 147/111 - - 81 14 90 % - -  04/28/17 1800 (!) 150/72 - - 83 (!) 26 96 % - -  04/28/17 1715 (!) 163/80 - - 89 (!) 23 99 % - -  04/28/17 1700 (!) 153/75 - - 90 (!) 32 98 % - -  04/28/17 1645 (!) 152/78 - - 88 (!) 27 97 % - -    04/28/17 1630 (!) 145/61 - - 92 (!) 26 98 % - -  04/28/17 1615 (!) 166/82 - - 92 (!) 21 97 % - -  04/28/17 1600 (!) 166/81 - - (!) 101 (!) 22 99 % - -  04/28/17 1539 - - - (!) 47 (!) 23 98 % - -  04/28/17 1537 (!) 120/102 - - - (!) 25 - - -  04/28/17 1504 (!) 131/98 100.2 F (37.9 C) Oral (!) 50 18 96 % - -  04/28/17 1500 - - - - - - 5\' 6"  (1.676 m) 73 kg (161 lb)    8:45 PM Reevaluation with update and discussion. After initial assessment and treatment, an updated evaluation reveals no change in clinical status.  He continued to have diarrhea in the ED, loose and malodorous.  Findings discussed with patient all questions. Daleen Bo   8:49 PM-Consult complete with hospitalist. Patient case explained and discussed.  He agrees to admit patient for further evaluation and treatment. Call ended at 9:27 PM   Final Clinical Impressions(s) / ED Diagnoses   Final diagnoses:  Near syncope  Clostridium difficile enterocolitis  Tachyarrhythmia    Patient presenting with weakness, associated with diarrhea and apparent ongoing C. difficile enterocolitis.  He has persistent diarrhea.  Patient had fever in the emergency department.  Screening testing indicated initial lactate elevation 2.0, which improved with fluids to 0.99.  Electrolyte panel indicates elevated creatinine 4.1, elevated BUN 35, low hemoglobin 11.9.  Creatinine level is unchanged from prior.  Patient had tachyarrhythmias in the emergency department, indicating instability, likely from fluid loss.  Patient will need to be admitted for stabilization, and  monitored until improved.  Nursing Notes Reviewed/ Care Coordinated Applicable Imaging Reviewed Interpretation of Laboratory Data incorporated into ED treatment   Plan: [place]   ED Discharge Orders    None       Daleen Bo, MD 04/28/17 2128

## 2017-04-28 NOTE — H&P (Signed)
History and Physical    Marcus Lane GQQ:[id number] DOB: 1931/05/20 DOA: 04/28/2017  Referring MD/NP/PA:   PCP: System, Pcp Not In   Patient coming from:  The patient is coming from home.  At baseline, pt is independent for most of ADL.     Chief Complaint: diarrhea and generalized weakness  HPI: Marcus Lane is a 84 y.o. male with medical history significant of ESRD (refused HD), hyperlipidemia, prostate cancer, iron deficiency anemia, tobacco abuse, polio, C. difficile colitis, who presents with diarrhea and generalized weakness.  Patient was recently hospitalized from 02/2-2/5 due to diarrhea, found to have positive C. difficile colitis. Patient was discharged to rehabilitation facility on vancomycin. Patient states that he completed 9 days of oral vancomycin. His diarrhea has improved. He was discharged home yesterday, but after he went home, his diarrhea has worsened. He has had 8 times of loose stool bowel movement since yesterday. Patient denies abdominal pain, nausea vomiting, but he has abdominal tenderness on physical examination. He has fever and chills. Patient does not have chest pain, shortness breath, cough, symptoms of UTI or unilateral weakness.Patient has a generalized weakness, particularly in both legs. Patient states that he was seen in pharmacy, and his legs gave away. He was found to have tachycardia with heart rate up to 160 by EMS.   ED Course: pt was found to have WBC 10.7, stable renal function, temperature 100.2, tachycardia, tachypnea, oxygen saturation 90-98% on room air. Lactic acid 2.01, 0.99, negative troponin, CT of abdomen and pelvis showed pancolitis. Patient is admitted to telemetry bed as inpatient.  Review of Systems:   General: has fevers, chills, no body weight gain, has poor appetite, has fatigue HEENT: no blurry vision, hearing changes or sore throat Respiratory: no dyspnea, coughing, wheezing CV: no chest pain, no palpitations GI: no nausea,  vomiting, abdominal pain, has diarrhea, no constipation GU: no dysuria, burning on urination, increased urinary frequency, hematuria  Ext: no leg edema Neuro: no unilateral weakness, numbness, or tingling, no vision change or hearing loss Skin: no rash, no skin tear. MSK: No muscle spasm, no deformity, no limitation of range of movement in spin Heme: No easy bruising.  Travel history: No recent long distant travel.  Allergy: No Known Allergies  Past Medical History:  Diagnosis Date  . Cancer [facility]) 2017   prostate  . CKD (chronic kidney disease), stage III ([place])   . Clostridium difficile colitis 04/2017  . Hypertension   . Polio    As a child    Past Surgical History:  Procedure Laterality Date  . CRYOABLATION N/A 06/09/2015   Procedure: CRYO ABLATION PROSTATE;  Surgeon: Carolan Clines, MD;  Location: [facility];  Service: Urology;  Laterality: N/A;  . orif arm Right 1963   accident w/surgical repair-rt arm    Social History:  reports that he has been smoking.  He has been smoking about 0.25 packs per day. he has never used smokeless tobacco. He reports that he drinks about 4.8 oz of alcohol per week. He reports that he does not use drugs.  Family History:  Family History  Problem Relation Age of Onset  . Diabetes Mother      Prior to Admission medications   Medication Sig Start Date End Date Taking? Authorizing Provider  calcium acetate (PHOSLO) 667 MG capsule Take by mouth 3 (three) times daily. 03/15/17   [provider]  FEROSUL 325 (65 Fe) MG tablet Take 325 mg by mouth 2 (two) times daily. 03/23/17  [provider]  finasteride (PROSCAR) 5 MG tablet Take 5 mg by mouth daily. 03/15/17   [provider]  furosemide (LASIX) 80 MG tablet Take 80 mg by mouth 3 (three) times daily. 03/15/17   [provider]  lovastatin (MEVACOR) 20 MG tablet Take 20 mg by mouth at bedtime.    [provider]  potassium chloride 20  MEQ/15ML (10%) SOLN Take 20 mEq by mouth 2 (two) times daily. 04/01/17   [provider]  sodium bicarbonate 650 MG tablet Take 2 tablets by mouth 2 (two) times daily. 03/18/17   [provider]  tamsulosin (FLOMAX) 0.4 MG CAPS capsule Take 0.4 mg by mouth daily. 03/15/17   [provider]  triamcinolone cream (KENALOG) 0.5 % Apply 1 application topically 2 (two) times daily.  03/25/17   [provider]    Physical Exam: Vitals:   04/28/17 2115 04/28/17 2215 04/28/17 2300 04/28/17 2357  BP: (!) 146/67 122/63 122/63 (!) 149/58  Pulse: 66 67  61  Resp: (!) 26 19 (!) 22 18  Temp:    99.1 F (37.3 C)  TempSrc:      SpO2: 99% 99%  97%  Weight:    72.2 kg (159 lb 2.8 oz)  Height:    5\' 6"  (1.676 m)   General: Not in acute distress HEENT:       Eyes: PERRL, EOMI, no scleral icterus.       ENT: No discharge from the ears and nose, no pharynx injection, no tonsillar enlargement.        Neck: No JVD, no bruit, no mass felt. Heme: No neck lymph node enlargement. Cardiac: S1/S2, RRR, No murmurs, No gallops or rubs. Respiratory: No rales, wheezing, rhonchi or rubs. GI: Soft, nondistended, has diffused tenderness, no rebound pain, no organomegaly, BS present. GU: No hematuria Ext: No pitting leg edema bilaterally. 2+DP/PT pulse bilaterally. Musculoskeletal: No joint deformities, No joint redness or warmth, no limitation of ROM in spin. Skin: No rashes.  Neuro: Alert, oriented X3, cranial nerves II-XII grossly intact, moves all extremities normally. Psych: Patient is not psychotic, no suicidal or hemocidal ideation.  Labs on Admission: I have personally reviewed following labs and imaging studies  CBC: Recent Labs  Lab 04/28/17 1606 04/28/17 2109 04/29/17 0033  WBC  --  10.7* 10.4  NEUTROABS  --  8.7*  --   HGB 11.9* 10.1* 9.9*  HCT 35.0* 31.1* 31.5*  MCV  --  93.7 92.4  PLT  --  174 016   Basic Metabolic Panel: Recent Labs  Lab 04/28/17 1537  04/28/17 1606 04/29/17 0033  NA 142 142 138  K 5.0 4.9 3.9  CL 113* 112* 111  CO2 17*  --  19*  GLUCOSE 101* 98 117*  BUN 42* 35* 39*  CREATININE 4.11* 4.10* 4.05*  CALCIUM 8.2*  --  7.3*   GFR: Estimated Creatinine Clearance: 12.3 mL/min (A) (by C-G formula based on SCr of 4.05 mg/dL (H)). Liver Function Tests: No results for input(s): AST, ALT, ALKPHOS, BILITOT, PROT, ALBUMIN in the last 168 hours. No results for input(s): LIPASE, AMYLASE in the last 168 hours. No results for input(s): AMMONIA in the last 168 hours. Coagulation Profile: No results for input(s): INR, PROTIME in the last 168 hours. Cardiac Enzymes: No results for input(s): CKTOTAL, CKMB, CKMBINDEX, TROPONINI in the last 168 hours. BNP (last 3 results) No results for input(s): PROBNP in the last 8760 hours. HbA1C: No results for input(s): HGBA1C in  the last 72 hours. CBG: No results for input(s): GLUCAP in the last 168 hours. Lipid Profile: No results for input(s): CHOL, HDL, LDLCALC, TRIG, CHOLHDL, LDLDIRECT in the last 72 hours. Thyroid Function Tests: No results for input(s): TSH, T4TOTAL, FREET4, T3FREE, THYROIDAB in the last 72 hours. Anemia Panel: No results for input(s): VITAMINB12, FOLATE, FERRITIN, TIBC, IRON, RETICCTPCT in the last 72 hours. Urine analysis:    Component Value Date/Time   COLORURINE YELLOW 04/09/2017 1947   APPEARANCEUR CLEAR 04/09/2017 1947   LABSPEC 1.013 04/09/2017 1947   PHURINE 6.0 04/09/2017 1947   GLUCOSEU 150 (A) 04/09/2017 1947   HGBUR MODERATE (A) 04/09/2017 [place] NEGATIVE 04/09/2017 1947   KETONESUR 5 (A) 04/09/2017 1947   PROTEINUR >=300 (A) 04/09/2017 1947   NITRITE NEGATIVE 04/09/2017 1947   LEUKOCYTESUR NEGATIVE 04/09/2017 1947   Sepsis Labs: @LABRCNTIP (procalcitonin:4,lacticidven:4) )No results found for this or any previous visit (from the past 240 hour(s)).   Radiological Exams on Admission: Ct Abdomen Pelvis Wo Contrast  Result Date:  04/28/2017 CLINICAL DATA:  84 year old male with abdominal pain. Concern for colitis or gastroenteritis. EXAM: CT ABDOMEN AND PELVIS WITHOUT CONTRAST TECHNIQUE: Multidetector CT imaging of the abdomen and pelvis was performed following the standard protocol without IV contrast. COMPARISON:  Abdominal CT dated 04/09/2017 FINDINGS: Evaluation of this exam is limited in the absence of intravenous contrast. Lower chest: Partially visualized small right and probable trace left pleural effusion. There is hypoattenuation of the cardiac blood pool suggestive of a degree of anemia. Clinical correlation is recommended. There is coronary vascular calcification. No intra-abdominal free air or free fluid. Hepatobiliary: The liver is unremarkable. No intrahepatic biliary ductal dilatation. There is probable small amount of sludge within the gallbladder. No calcified stone. No pericholecystic fluid. Pancreas: Unremarkable. No pancreatic ductal dilatation or surrounding inflammatory changes. Spleen: Normal in size without focal abnormality. Adrenals/Urinary Tract: The adrenal glands are unremarkable. There is mild right hydronephroureter. No obstructing stone. The left kidney is unremarkable the urinary bladder is partially distended and appears grossly unremarkable for the degree of distention Stomach/Bowel: There is diffuse thickened and inflamed appearance of the colon consistent with pancolitis. Correlation with clinical exam and stool cultures recommended there is no bowel obstruction. The appendix is normal. Vascular/Lymphatic: Advanced aortoiliac atherosclerotic disease. No portal venous gas. There is no adenopathy. Reproductive: The prostate gland is enlarged and somewhat asymmetric and nodular. It measures approximately 6 cm in transverse diameter. Clinical correlation is recommended. Other: There is diffuse stranding of the mesentery and subcutaneous edema. Small amount of fluid noted in the left inguinal canal.  Musculoskeletal: Degenerative changes of the spine. No acute osseous pathology. IMPRESSION: 1. Pancolitis. Correlation with clinical exam and stool cultures recommended. No bowel obstruction. Normal appendix. 2. Partially visualized small pleural effusions. Diffuse mesenteric edema, and anasarca 3. Anemia. 4. Enlarged prostate gland. 5. Mild right hydronephroureter relatively similar to prior CT. 6. Advanced Aortic Atherosclerosis (ICD10-I70.0). Electronically Signed   By: Anner Crete M.D.   On: 04/28/2017 23:00     EKG: Independently reviewed.  Sinus rhythm, QTC 328, mild T-wave inversion in V5 to V6  Assessment/Plan Principal Problem:   Diarrhea Active Problems:   End stage renal disease (H[facility])   Essential hypertension   Dyslipidemia   Clostridium difficile colitis   Sepsis ([place])   Tobacco abuse   Diarrhea and sepsis: most likely due to C. difficile colitis. CT abdomen/pelvis showed pancolitis. Patient meets criteria for sepsis with fever, leukocytosis, and tachypnea and tachycardia. lactate  slightly elevated at 2.01, then 0.99. Currently hemodynamically stable.  -will admit to tele bed -start oral vancomycin -will get Procalcitonin and trend lactic acid levels per sepsis protocol. -IVF: 500 cL of NS bolus in ED, followed by 50 cc/h   -blood culture - consult SW and CM  End stage renal disease and known right hydronephroureter: pt refused hemodialysis before. When asked again, he still refuses hemodialysis now. -Follow up renal function by BMP -hold lasix temporarily  HTN:  -Hold Lasix temporarily -Patient is on Flomax BPH -IV hydralazine prn  HLD -pravastatin  Tobacco abuse: -Did counseling about importance of quitting smoking -Nicotine patch  Anemia due to ESRD: Hemoglobin stable, 11.9 -continue iron supplement   DVT ppx: SQ Heparin   Code Status: Full code Family Communication: None at bed side. Disposition Plan:  Anticipate discharge back to previous home  environment Consults called:  none Admission status:   Inpatient/tele     Date of Service 04/29/2017    Ivor Costa [facility] Pager [phone]  If 7PM-7AM, please contact night-coverage [email or web address] Password [facility] 04/29/2017, 3:25 AM

## 2017-04-29 DIAGNOSIS — A419 Sepsis, unspecified organism: Secondary | ICD-10-CM

## 2017-04-29 DIAGNOSIS — R45851 Suicidal ideations: Secondary | ICD-10-CM

## 2017-04-29 DIAGNOSIS — G4700 Insomnia, unspecified: Secondary | ICD-10-CM

## 2017-04-29 DIAGNOSIS — A0471 Enterocolitis due to Clostridium difficile, recurrent: Secondary | ICD-10-CM

## 2017-04-29 DIAGNOSIS — Z7189 Other specified counseling: Secondary | ICD-10-CM

## 2017-04-29 DIAGNOSIS — F17210 Nicotine dependence, cigarettes, uncomplicated: Secondary | ICD-10-CM

## 2017-04-29 DIAGNOSIS — R531 Weakness: Secondary | ICD-10-CM

## 2017-04-29 DIAGNOSIS — Z79899 Other long term (current) drug therapy: Secondary | ICD-10-CM

## 2017-04-29 DIAGNOSIS — F4321 Adjustment disorder with depressed mood: Secondary | ICD-10-CM

## 2017-04-29 DIAGNOSIS — I10 Essential (primary) hypertension: Secondary | ICD-10-CM

## 2017-04-29 DIAGNOSIS — A0472 Enterocolitis due to Clostridium difficile, not specified as recurrent: Secondary | ICD-10-CM

## 2017-04-29 LAB — BASIC METABOLIC PANEL
Anion gap: 8 (ref 5–15)
BUN: 39 mg/dL — AB (ref 6–20)
CHLORIDE: 111 mmol/L (ref 101–111)
CO2: 19 mmol/L — AB (ref 22–32)
CREATININE: 4.05 mg/dL — AB (ref 0.61–1.24)
Calcium: 7.3 mg/dL — ABNORMAL LOW (ref 8.9–10.3)
GFR calc Af Amer: 14 mL/min — ABNORMAL LOW (ref >60)
GFR calc non Af Amer: 12 mL/min — ABNORMAL LOW (ref >60)
Glucose, Bld: 117 mg/dL — ABNORMAL HIGH (ref 65–99)
Potassium: 3.9 mmol/L (ref 3.5–5.1)
SODIUM: 138 mmol/L (ref 135–145)

## 2017-04-29 LAB — CBC
HCT: 31.5 % — ABNORMAL LOW (ref 39.0–52.0)
HEMOGLOBIN: 9.9 g/dL — AB (ref 13.0–17.0)
MCH: 29 pg (ref 26.0–34.0)
MCHC: 31.4 g/dL (ref 30.0–36.0)
MCV: 92.4 fL (ref 78.0–100.0)
Platelets: 174 10*3/uL (ref 150–400)
RBC: 3.41 MIL/uL — AB (ref 4.22–5.81)
RDW: 17.6 % — ABNORMAL HIGH (ref 11.5–15.5)
WBC: 10.4 10*3/uL (ref 4.0–10.5)

## 2017-04-29 LAB — C DIFFICILE QUICK SCREEN W PCR REFLEX
C DIFFICLE (CDIFF) ANTIGEN: POSITIVE — AB
C Diff interpretation: DETECTED
C Diff toxin: POSITIVE — AB

## 2017-04-29 LAB — LACTIC ACID, PLASMA
LACTIC ACID, VENOUS: 1 mmol/L (ref 0.5–1.9)
Lactic Acid, Venous: 1.4 mmol/L (ref 0.5–1.9)

## 2017-04-29 MED ORDER — ENSURE ENLIVE PO LIQD
237.0000 mL | Freq: Two times a day (BID) | ORAL | Status: DC
  Administered 2017-04-29 – 2017-05-02 (×7): 237 mL via ORAL

## 2017-04-29 MED ORDER — VITAMIN E 45 MG (100 UNIT) PO CAPS
100.0000 [IU] | ORAL_CAPSULE | Freq: Every day | ORAL | Status: DC
  Administered 2017-04-29 – 2017-05-02 (×4): 100 [IU] via ORAL
  Filled 2017-04-29 (×4): qty 1

## 2017-04-29 MED ORDER — FIDAXOMICIN 200 MG PO TABS
200.0000 mg | ORAL_TABLET | Freq: Two times a day (BID) | ORAL | Status: DC
  Administered 2017-04-29 – 2017-05-02 (×7): 200 mg via ORAL
  Filled 2017-04-29 (×8): qty 1

## 2017-04-29 NOTE — Progress Notes (Signed)
MD ordered psych consult for SI. Patient was assessed and is not currently expressing thought to harm himself or others.  Per policy, at risk for suicide is defined as  "patients who are deemed to be a potential danger to themselves, or others, have attempted suicide in the past, or are presently voicing suicidal ideation."  MD does not feel patient is a danger to himself, and patient was not placed on suicide precautions.  Charge nurse, and tech are aware and staff will be making frequent verbal contacts with patient and door is to be left open.

## 2017-04-29 NOTE — Progress Notes (Signed)
Patient's stool came back (+) for C-diff toxin, (+) C-diff antigen. Patient already on p.o vancomycin.Bodenheimer,NP made aware via text page.

## 2017-04-29 NOTE — Evaluation (Signed)
Physical Therapy Evaluation Patient Details Name: Marcus Lane MRN: [id number] DOB: 17-Jun-1931 Today's Date: 04/29/2017   History of Present Illness  84yo male presenting with diarrhea and generalized weakness, recently hospitalized 04/09/17 to 04/12/17 due to diarrhea, C-diff and discharged to rehab facility. He was discharged from the rehab facility and went home, where his diarrhea worsened. Diagnosed with diarrhea and sepsis likely due to C-diff. PMH prostate CA, CKD stage III, C-diff, HTN, polio, hx ORIF R UE   Clinical Impression   Patient received in bed, pleasant and willing to work with skilled PT services. He requires MinA to perform functional bed mobility, as well as cues for safety with mobility and cues to avoid pulling/tugging on catheter line. He is able to perform functional transfers also with MinA and RW, and was able to ambulate approximately 80f with RW and Min guard, very shaky and with generalized unsteadiness. Gait distance limited by fatigue today. Patient was left in bed with all needs met, bed alarm activated and sitter present. Moving forward he will strongly benefit from ST-SNF prior to return home in order to address functional deficits, optimize safety, and reduce fall risk.     Follow Up Recommendations SNF;Supervision for mobility/OOB    Equipment Recommendations  Rolling walker with 5" wheels    Recommendations for Other Services       Precautions / Restrictions Precautions Precautions: Fall;Other (comment) Precaution Comments: enteric precautions  Restrictions Weight Bearing Restrictions: No      Mobility  Bed Mobility Overal bed mobility: Needs Assistance Bed Mobility: Supine to Sit     Supine to sit: Min assist     General bed mobility comments: MinA to bring trunk up and rotate around to EOB, able to scoot forward at edge of bed with close Min guard   Transfers Overall transfer level: Needs assistance Equipment used: Rolling walker (2  wheeled) Transfers: Sit to/from Stand Sit to Stand: Min assist         General transfer comment: MinA for power up and steadying initially while standing at EOB, genearlly unsteady   Ambulation/Gait Ambulation/Gait assistance: Min guard Ambulation Distance (Feet): 10 Feet Assistive device: Rolling walker (2 wheeled) Gait Pattern/deviations: Step-through pattern;Decreased stride length;Decreased step length - right;Decreased step length - left;Trunk flexed     General Gait Details: limited to in-room distances due to C-diff, very fatigued after gait training approximately 174fin room, shaky and generally unsteady due to weakness, cues for safety   Stairs            Wheelchair Mobility    Modified Rankin (Stroke Patients Only)       Balance Overall balance assessment: Needs assistance Sitting-balance support: Bilateral upper extremity supported;Feet supported Sitting balance-Leahy Scale: Fair Sitting balance - Comments: Min guard for safety    Standing balance support: Bilateral upper extremity supported;During functional activity Standing balance-Leahy Scale: Poor Standing balance comment: requires UE support                             Pertinent Vitals/Pain Pain Assessment: No/denies pain    Home Living Family/patient expects to be discharged to:: Private residence Living Arrangements: Alone   Type of Home: Apartment Home Access: Level entry     Home Layout: Two level Home Equipment: Walker - 2 wheels;Cane - single point      Prior Function Level of Independence: Independent with assistive device(s)  Hand Dominance        Extremity/Trunk Assessment        Lower Extremity Assessment Lower Extremity Assessment: Generalized weakness    Cervical / Trunk Assessment Cervical / Trunk Assessment: Normal  Communication   Communication: No difficulties;Prefers language other than English(Spanish speaking but  communicates well in english )  Cognition Arousal/Alertness: Awake/alert Behavior During Therapy: WFL for tasks assessed/performed Overall Cognitive Status: Within Functional Limits for tasks assessed                                        General Comments      Exercises     Assessment/Plan    PT Assessment Patient needs continued PT services  PT Problem List Decreased strength;Decreased mobility;Decreased activity tolerance;Decreased knowledge of use of DME;Decreased safety awareness;Decreased coordination;Decreased knowledge of precautions;Decreased balance       PT Treatment Interventions Gait training;Therapeutic exercise;Patient/family education;DME instruction;Therapeutic activities;Functional mobility training;Stair training;Balance training    PT Goals (Current goals can be found in the Care Plan section)  Acute Rehab PT Goals Patient Stated Goal: to get well  PT Goal Formulation: With patient Time For Goal Achievement: 05/13/17 Potential to Achieve Goals: Fair    Frequency Min 2X/week   Barriers to discharge        Co-evaluation               AM-PAC PT "6 Clicks" Daily Activity  Outcome Measure Difficulty turning over in bed (including adjusting bedclothes, sheets and blankets)?: Unable Difficulty moving from lying on back to sitting on the side of the bed? : Unable Difficulty sitting down on and standing up from a chair with arms (e.g., wheelchair, bedside commode, etc,.)?: Unable Help needed moving to and from a bed to chair (including a wheelchair)?: A Little Help needed walking in hospital room?: A Little Help needed climbing 3-5 steps with a railing? : A Lot 6 Click Score: 11    End of Session Equipment Utilized During Treatment: Gait belt Activity Tolerance: Patient limited by fatigue Patient left: in bed;with bed alarm set;with nursing/sitter in room;with call bell/phone within reach   PT Visit Diagnosis: Unsteadiness on feet  (R26.81);Muscle weakness (generalized) (M62.81);Difficulty in walking, not elsewhere classified (R26.2)    Time: 1423-9532 PT Time Calculation (min) (ACUTE ONLY): 18 min   Charges:   PT Evaluation $PT Eval Moderate Complexity: 1 Mod     PT G Codes:        Deniece Ree PT, DPT, CBIS  Supplemental Physical Therapist [place]   Pager [phone]

## 2017-04-29 NOTE — Progress Notes (Signed)
Initial Nutrition Assessment  DOCUMENTATION CODES:   Non-severe (moderate) malnutrition in context of acute illness/injury  INTERVENTION:   Ensure Enlive po BID, each supplement provides 350 kcal and 20 grams of protein  Pt may benefit from smaller, more frequent meals  Pt may benefit from diet liberalization   NUTRITION DIAGNOSIS:   Moderate Malnutrition related to acute illness(C.diff colitis) as evidenced by mild fat depletion, mild muscle depletion, edema.  GOAL:   Patient will meet greater than or equal to 90% of their needs  MONITOR:   PO intake, Supplement acceptance, Labs, Weight trends  REASON FOR ASSESSMENT:   Malnutrition Screening Tool    ASSESSMENT:   84 yo male admitted with generalized weakness and diarrhea with C.diff colitis with admission earlier this month for the same. Pt with hx of ESRD, not on HD (refused), HLD, iron deficiency anemia, prostat cancer, polio, C.diff   Pt did eat some breakfast this AM but vomited small amount after breakfast this AM. Pt reports he as vomited at home as well.  Pt reports he has not eaten anything since breakfast this AM. Pt reports usually he has a good appetite and eats 3 meals per day. Noted recent hospitalization this month with same symptoms (weakness, diarrhea, C.diff colits). Also noted pt with thoughts of suicide over the past 8 days with psych consult ordered.  Pt has likely been eating <75% of estimated energy requirements for > 7 days.  Pt unsure if he has lost any weight recently but reports normal weight of 160 pounds. Current wt 159 pounds. Noted weight of 161 pounds on admission earlier this month. 1.2% weight loss in a few weeks. Of note, pt with severe edema present, unsure of dry weight. Edema likely masking weight loss  Labs: ionized calcium 1.1 (L), Creatinine 4.05, BUN 39 Meds: ferrous sulfate, vitmain E  NUTRITION - FOCUSED PHYSICAL EXAM:    Most Recent Value  Orbital Region  Mild depletion  Upper  Arm Region  Mild depletion  Thoracic and Lumbar Region  No depletion  Buccal Region  No depletion  Temple Region  Mild depletion  Clavicle Bone Region  No depletion  Clavicle and Acromion Bone Region  No depletion  Scapular Bone Region  No depletion  Dorsal Hand  Mild depletion  Patellar Region  Mild depletion  Anterior Thigh Region  Mild depletion  Posterior Calf Region  Mild depletion  Edema (RD Assessment)  Severe       Diet Order:  Diet renal with fluid restriction Fluid restriction: 1200 mL Fluid; Room service appropriate? Yes; Fluid consistency: Thin  EDUCATION NEEDS:   Not appropriate for education at this time  Skin:  Skin Assessment: Skin Integrity Issues: Skin Integrity Issues:: Other (Comment) Other: MASD: sacrum  Last BM:  2/21  Height:   Ht Readings from Last 1 Encounters:  04/28/17 5\' 6"  (1.676 m)    Weight:   Wt Readings from Last 1 Encounters:  04/28/17 159 lb 2.8 oz (72.2 kg)    Ideal Body Weight:     BMI:  Body mass index is 25.69 kg/m.  Estimated Nutritional Needs:   Kcal:  1650-1850 kcals  Protein:  83-93 g  Fluid:  >/= 1.5 L  Kerman Passey MS, RD, LDN, CNSC [phone] Pager  [phone] Weekend/On-Call Pager

## 2017-04-29 NOTE — Consult Note (Addendum)
[facility] Psychiatry Consult   Reason for Consult:  SI Referring Physician:  Dr. Cruzita Lederer Patient Identification: Marcus Lane MRN:  [id number] Principal Diagnosis: Adjustment disorder with depressed mood Diagnosis:   Patient Active Problem List   Diagnosis Date Noted  . Diarrhea [R19.7] 04/28/2017  . Sepsis ([place]) [A41.9] 04/28/2017  . Tobacco abuse [Z72.0] 04/28/2017  . End stage renal disease ([place]) [N18.6] 04/14/2017  . History of poliomyelitis [Z86.12] 04/14/2017  . Anemia of chronic disease [D63.8] 04/14/2017  . Essential hypertension [I10] 04/14/2017  . History of prostate cancer [Z85.46] 04/14/2017  . Dyslipidemia [E78.5] 04/14/2017  . Colitis [K52.9] 04/09/2017  . Clostridium difficile colitis [A04.72] 04/08/2017    Total Time spent with patient: 1 hour  Subjective:   Marcus Lane is a 84 y.o. male patient admitted with diarrhea and generalized weakness with recurrent C. Diff.  HPI:   Per chart review, patient is receiving treatment for C. Diff with Vancomycin. He endorsed SI on admission so psychiatry was consulted. He currently denies SI according to notes.  On interview, Marcus Lane reports that he had thoughts to harm himself earlier and considered different plans (although he did not elaborate on these plans) due to frustration related to his medical problems. He reports that he has been suffering for 2 years and also reports physical pain.  He reports returning home after his last hospitalization for one day and then he was admitted to the hospital again. It has been hard for him to complete his ADLs due to his current illness. He fell while in the store prior to admission. He has been vomiting and unable to tolerate food. He reports poor sleep. He denies a prior psychiatric history. He denies current SI and reports that he would be "too chicken" to harm himself. He denies access to weapons. He denies AVH. He reports that he would appreciate assistance with his daily  needs and informed me that he had previously spoken to a home health agency.  Past Psychiatric History: Denies   Risk to Self: Is patient at risk for suicide?: No.  Risk to Others:  None. Prior Inpatient Therapy:  Denies  Prior Outpatient Therapy:  Denies   Past Medical History:  Past Medical History:  Diagnosis Date  . Cancer [facility]) 2017   prostate  . CKD (chronic kidney disease), stage III ([place])   . Clostridium difficile colitis 04/2017  . Hypertension   . Polio    As a child    Past Surgical History:  Procedure Laterality Date  . CRYOABLATION N/A 06/09/2015   Procedure: CRYO ABLATION PROSTATE;  Surgeon: Carolan Clines, MD;  Location: [facility];  Service: Urology;  Laterality: N/A;  . orif arm Right 1963   accident w/surgical repair-rt arm   Family History:  Family History  Problem Relation Age of Onset  . Diabetes Mother    Family Psychiatric  History: Denies  Social History:  Social History   Substance and Sexual Activity  Alcohol Use Yes  . Alcohol/week: 4.8 oz  . Types: 8 Cans of beer per week     Social History   Substance and Sexual Activity  Drug Use No    Social History   Socioeconomic History  . Marital status: Legally Separated    Spouse name: None  . Number of children: None  . Years of education: None  . Highest education level: None  Social Needs  . Financial resource strain: None  . Food insecurity - worry: None  .  Food insecurity - inability: None  . Transportation needs - medical: None  . Transportation needs - non-medical: None  Occupational History  . None  Tobacco Use  . Smoking status: Light Tobacco Smoker    Packs/day: 0.25  . Smokeless tobacco: Never Used  Substance and Sexual Activity  . Alcohol use: Yes    Alcohol/week: 4.8 oz    Types: 8 Cans of beer per week  . Drug use: No  . Sexual activity: None  Other Topics Concern  . None  Social History Narrative  . None   Additional Social History: He  lives at home alone. His family lives in [place] but he has a close friend that lives nearby. He previously worked in [employer]. He retired in 2012. He reports drinking 1-2 beers daily. He last drank on 2/1 prior to his current illness. He denies illicit substance use. He reports smoking 15 cigarettes daily for greater than 60 years.    Allergies:  No Known Allergies  Labs:  Results for orders placed or performed during the hospital encounter of 04/28/17 (from the past 48 hour(s))  Basic metabolic panel     Status: Abnormal   Collection Time: 04/28/17  3:37 PM  Result Value Ref Range   Sodium 142 135 - 145 mmol/L   Potassium 5.0 3.5 - 5.1 mmol/L   Chloride 113 (H) 101 - 111 mmol/L   CO2 17 (L) 22 - 32 mmol/L   Glucose, Bld 101 (H) 65 - 99 mg/dL   BUN 42 (H) 6 - 20 mg/dL   Creatinine, Ser 4.11 (H) 0.61 - 1.24 mg/dL   Calcium 8.2 (L) 8.9 - 10.3 mg/dL   GFR calc non Af Amer 12 (L) >60 mL/min   GFR calc Af Amer 14 (L) >60 mL/min    Comment: (NOTE) The eGFR has been calculated using the CKD EPI equation. This calculation has not been validated in all clinical situations. eGFR's persistently <60 mL/min signify possible Chronic Kidney Disease.    Anion gap 12 5 - 15    Comment: Performed at [facility] [address] [place], [place] [address]  I-stat Chem 8, ED     Status: Abnormal   Collection Time: 04/28/17  4:06 PM  Result Value Ref Range   Sodium 142 135 - 145 mmol/L   Potassium 4.9 3.5 - 5.1 mmol/L   Chloride 112 (H) 101 - 111 mmol/L   BUN 35 (H) 6 - 20 mg/dL   Creatinine, Ser 4.10 (H) 0.61 - 1.24 mg/dL   Glucose, Bld 98 65 - 99 mg/dL   Calcium, Ion 1.10 (L) 1.15 - 1.40 mmol/L   TCO2 19 (L) 22 - 32 mmol/L   Hemoglobin 11.9 (L) 13.0 - 17.0 g/dL   HCT 35.0 (L) 39.0 - 52.0 %  I-stat troponin, ED     Status: None   Collection Time: 04/28/17  4:06 PM  Result Value Ref Range   Troponin i, poc 0.04 0.00 - 0.08 ng/mL   Comment 3            Comment: Due to the release  kinetics of cTnI, a negative result within the first hours of the onset of symptoms does not rule out myocardial infarction with certainty. If myocardial infarction is still suspected, repeat the test at appropriate intervals.   I-Stat CG4 Lactic Acid, ED     Status: Abnormal   Collection Time: 04/28/17  4:06 PM  Result Value Ref Range   Lactic Acid,  Venous 2.01 (HH) 0.5 - 1.9 mmol/L   Comment NOTIFIED PHYSICIAN   I-Stat CG4 Lactic Acid, ED     Status: None   Collection Time: 04/28/17  5:44 PM  Result Value Ref Range   Lactic Acid, Venous 0.99 0.5 - 1.9 mmol/L  CBC with Differential     Status: Abnormal   Collection Time: 04/28/17  9:09 PM  Result Value Ref Range   WBC 10.7 (H) 4.0 - 10.5 K/uL   RBC 3.32 (L) 4.22 - 5.81 MIL/uL   Hemoglobin 10.1 (L) 13.0 - 17.0 g/dL   HCT 31.1 (L) 39.0 - 52.0 %   MCV 93.7 78.0 - 100.0 fL   MCH 30.4 26.0 - 34.0 pg   MCHC 32.5 30.0 - 36.0 g/dL   RDW 17.5 (H) 11.5 - 15.5 %   Platelets 174 150 - 400 K/uL   Neutrophils Relative % 81 %   Lymphocytes Relative 13 %   Monocytes Relative 6 %   Eosinophils Relative 0 %   Basophils Relative 0 %   Neutro Abs 8.7 (H) 1.7 - 7.7 K/uL   Lymphs Abs 1.4 0.7 - 4.0 K/uL   Monocytes Absolute 0.6 0.1 - 1.0 K/uL   Eosinophils Absolute 0.0 0.0 - 0.7 K/uL   Basophils Absolute 0.0 0.0 - 0.1 K/uL   WBC Morphology INCREASED BANDS (>20% BANDS)     Comment: Performed at [facility], [address] [place], [place] [address]  Procalcitonin     Status: None   Collection Time: 04/28/17 10:05 PM  Result Value Ref Range   Procalcitonin 3.85 ng/mL    Comment:        Interpretation: PCT > 2 ng/mL: Systemic infection (sepsis) is likely, unless other causes are known. (NOTE)       Sepsis PCT Algorithm           Lower Respiratory Tract                                      Infection PCT Algorithm    ----------------------------     ----------------------------         PCT < 0.25 ng/mL                PCT < 0.10  ng/mL         Strongly encourage             Strongly discourage   discontinuation of antibiotics    initiation of antibiotics    ----------------------------     -----------------------------       PCT 0.25 - 0.50 ng/mL            PCT 0.10 - 0.25 ng/mL               OR       >80% decrease in PCT            Discourage initiation of                                            antibiotics      Encourage discontinuation           of antibiotics    ----------------------------     -----------------------------         PCT >= 0.50 ng/mL  PCT 0.26 - 0.50 ng/mL               AND       <80% decrease in PCT              Encourage initiation of                                             antibiotics       Encourage continuation           of antibiotics    ----------------------------     -----------------------------        PCT >= 0.50 ng/mL                  PCT > 0.50 ng/mL               AND         increase in PCT                  Strongly encourage                                      initiation of antibiotics    Strongly encourage escalation           of antibiotics                                     -----------------------------                                           PCT <= 0.25 ng/mL                                                 OR                                        > 80% decrease in PCT                                     Discontinue / Do not initiate                                             antibiotics Performed at [facility], [address] [place], [place] [address]   Lactic acid, plasma     Status: None   Collection Time: 04/28/17 10:58 PM  Result Value Ref Range   Lactic Acid, Venous 1.4 0.5 - 1.9 mmol/L    Comment: Performed at [facility] [address] [place], [place] [address]  Lactic acid, plasma     Status: None   Collection Time: 04/29/17 12:33 AM  Result Value  Ref Range   Lactic Acid, Venous 1.0 0.5 - 1.9 mmol/L     Comment: Performed at [facility] [address] [place], [place] [address]  Basic metabolic panel     Status: Abnormal   Collection Time: 04/29/17 12:33 AM  Result Value Ref Range   Sodium 138 135 - 145 mmol/L   Potassium 3.9 3.5 - 5.1 mmol/L    Comment: DELTA CHECK NOTED   Chloride 111 101 - 111 mmol/L   CO2 19 (L) 22 - 32 mmol/L   Glucose, Bld 117 (H) 65 - 99 mg/dL   BUN 39 (H) 6 - 20 mg/dL   Creatinine, Ser 4.05 (H) 0.61 - 1.24 mg/dL   Calcium 7.3 (L) 8.9 - 10.3 mg/dL   GFR calc non Af Amer 12 (L) >60 mL/min   GFR calc Af Amer 14 (L) >60 mL/min    Comment: (NOTE) The eGFR has been calculated using the CKD EPI equation. This calculation has not been validated in all clinical situations. eGFR's persistently <60 mL/min signify possible Chronic Kidney Disease.    Anion gap 8 5 - 15    Comment: Performed at [facility] [address], [place], [place] [address]  CBC     Status: Abnormal   Collection Time: 04/29/17 12:33 AM  Result Value Ref Range   WBC 10.4 4.0 - 10.5 K/uL   RBC 3.41 (L) 4.22 - 5.81 MIL/uL   Hemoglobin 9.9 (L) 13.0 - 17.0 g/dL   HCT 31.5 (L) 39.0 - 52.0 %   MCV 92.4 78.0 - 100.0 fL   MCH 29.0 26.0 - 34.0 pg   MCHC 31.4 30.0 - 36.0 g/dL   RDW 17.6 (H) 11.5 - 15.5 %   Platelets 174 150 - 400 K/uL    Comment: Performed at [facility], [place] [address] [place], [place] [address]  C difficile quick scan w PCR reflex     Status: Abnormal   Collection Time: 04/29/17  1:16 AM  Result Value Ref Range   C Diff antigen POSITIVE (A) NEGATIVE   C Diff toxin POSITIVE (A) NEGATIVE   C Diff interpretation Toxin producing C. difficile detected.     Comment: CRITICAL RESULT CALLED TO, READ BACK BY AND VERIFIED WITH: Marcheta Grammes RN 04/29/17 0551 JDW Performed at [facility], [address] [place], [place] [address]     Current Facility-Administered Medications  Medication Dose Route Frequency Provider Last Rate Last Dose  . 0.9 %  sodium  chloride infusion   Intravenous Continuous Ivor Costa, MD 50 mL/hr at 04/29/17 0012    . acetaminophen (TYLENOL) tablet 650 mg  650 mg Oral Q6H PRN Ivor Costa, MD       Or  . acetaminophen (TYLENOL) suppository 650 mg  650 mg Rectal Q6H PRN Ivor Costa, MD      . calcium acetate (PHOSLO) capsule 667 mg  667 mg Oral TID WC Ivor Costa, MD      . ferrous sulfate tablet 325 mg  325 mg Oral BID Ivor Costa, MD   325 mg at 04/29/17 0014  . fidaxomicin (DIFICID) tablet 200 mg  200 mg Oral BID Caren Griffins, MD      . finasteride (PROSCAR) tablet 5 mg  5 mg Oral Daily Ivor Costa, MD      . heparin injection 5,000 Units  5,000 Units Subcutaneous Q8H Ivor Costa, MD   5,000 Units at 04/29/17 [phone]  . hydrALAZINE (  APRESOLINE) injection 5 mg  5 mg Intravenous Q2H PRN Ivor Costa, MD      . loperamide (IMODIUM) capsule 4 mg  4 mg Oral PRN Daleen Bo, MD   4 mg at 04/28/17 1908  . nicotine (NICODERM CQ - dosed in mg/24 hours) patch 21 mg  21 mg Transdermal Daily Ivor Costa, MD   21 mg at 04/29/17 0014  . ondansetron (ZOFRAN) tablet 4 mg  4 mg Oral Q6H PRN Ivor Costa, MD       Or  . ondansetron [facility]) injection 4 mg  4 mg Intravenous Q6H PRN Ivor Costa, MD      . pravastatin (PRAVACHOL) tablet 20 mg  20 mg Oral q1800 Ivor Costa, MD      . sodium bicarbonate tablet 1,300 mg  1,300 mg Oral BID Ivor Costa, MD      . tamsulosin (FLOMAX) capsule 0.4 mg  0.4 mg Oral Daily Ivor Costa, MD      . triamcinolone cream (KENALOG) 0.5 % 1 application  1 application Topical BID Ivor Costa, MD      . vitamin E capsule 100 Units  100 Units Oral Daily Ivor Costa, MD      . zolpidem (AMBIEN) tablet 5 mg  5 mg Oral QHS PRN Ivor Costa, MD        Musculoskeletal: Strength & Muscle Tone: decreased due to physical deconditioning.  Gait & Station: UTA since patient was lying in bed. Patient leans: N/A  Psychiatric Specialty Exam: Physical Exam  Nursing note and vitals reviewed. Constitutional: He is oriented to person,  place, and time. He appears well-developed.  thin  HENT:  Head: Normocephalic and atraumatic.  Neck: Normal range of motion.  Respiratory: Effort normal.  Musculoskeletal: Normal range of motion.  Neurological: He is alert and oriented to person, place, and time.  Skin: No rash noted.  Psychiatric: His speech is normal and behavior is normal. Judgment and thought content normal. Cognition and memory are normal. He exhibits a depressed mood.    Review of Systems  Constitutional: Positive for chills. Negative for fever.  Cardiovascular: Negative for chest pain.  Gastrointestinal: Positive for diarrhea, nausea and vomiting. Negative for abdominal pain.  Psychiatric/Behavioral: Negative for depression, hallucinations, substance abuse and suicidal ideas. The patient has insomnia. The patient is not nervous/anxious.   All other systems reviewed and are negative.   Blood pressure (!) 146/64, pulse 62, temperature 98.4 F (36.9 C), resp. rate 16, height '5\' 6"'  (1.676 m), weight 72.2 kg (159 lb 2.8 oz), SpO2 98 %.Body mass index is 25.69 kg/m.  General Appearance: Well Groomed, elderly, man with gray thinning hair, corrective lens, wearing a hospital gown and lying in bed. NAD.   Eye Contact:  Good  Speech:  Clear and Coherent and Normal Rate  Volume:  Normal  Mood:  "I've been suffering for two years."   Affect:  Depressed  Thought Process:  Goal Directed, Linear and Descriptions of Associations: Intact  Orientation:  Full (Time, Place, and Person)  Thought Content:  Logical  Suicidal Thoughts:  No  Homicidal Thoughts:  No  Memory:  Immediate;   Good Recent;   Good Remote;   Good  Judgement:  Fair  Insight:  Fair  Psychomotor Activity:  Normal  Concentration:  Concentration: Good and Attention Span: Good  Recall:  Good  Fund of Knowledge:  Good  Language:  Good  Akathisia:  No  Handed:  Right  AIMS (if indicated):   N/A  Assets:  Communication Skills Housing  ADL's:  Impaired   Cognition:  WNL  Sleep:   Poor   Assessment:  Marcus Lane is a 84 y.o. male who was admitted with recurrent C-Diff. He endorsed SI earlier due to frustration with his medical conditions. He denies SI and is grateful for any services for assistance with his daily needs. He is appropriate for a SNF to assist with his ADLs and management of his medical problems. He declines medication management for depression at this time but it may be beneficial for him and should be encouraged if his mood does not improve in the near future.   Treatment Plan Summary: -Patient declines medication management for mood at this time. The need for medications should be reassessed in the near future by his outpatient provider.  -Start Melatonin 3-6 mg qhs for insomnia.  -Discontinue suicide precautions since patient is not an imminent risk of harm to self.  -Patient is psychiatrically cleared. Psychiatry will sign off on patient at this time. Please consult psychiatry again as needed.   Disposition: No evidence of imminent risk to self or others at present.   Patient does not meet criteria for psychiatric inpatient admission.  Faythe Dingwall, DO 04/29/2017 10:34 AM

## 2017-04-29 NOTE — Progress Notes (Signed)
Patient c/o bladder pain,bladder scan showed >755. Patient also going in and out of atrial fibrillation,normally NSR. BP 161/76 HR 72  T 98.4 100% on RA. Bodenheimer,NP notified,order received. In and out cath performed and obtained 825 cc of amber urine. Patient verbalized bladder relief. Noor Witte, Wonda Cheng, Therapist, sports

## 2017-04-29 NOTE — Progress Notes (Signed)
Psych came to evaluate patient, placed on suicide precautions, room set-up per policy and sitter at the bedside.

## 2017-04-29 NOTE — Progress Notes (Signed)
Progress Note   Marcus Lane CFP:[id number] DOB: 07-05-1931 DOA: 04/28/2017 PCP: System, Pcp Not In   LOS: 1 day    Brief Narrative:  Marcus Lane is a 84 year old male with a medical history significant for ESRD (refuses HD), hyperlipidemia, prostate cancer, iron deficiency anemia, tobacco abuse, polio, and c. Difficile colitis who presented to the ED on 04/28/17 with increased episodes of diarrhea and generalized weakness. Patient was last seen in the ED 04/09/17-04/12/17 and was treated for C. difficile colitis and discharged to rehab on oral vancomycin. Patient cannot remember if he finished the oral vancomycin, but never refused medical treatment at rehab, so most likely finished entire course. He was discharged from rehab on 04/27/17 back home where his diarrhea worsened. Patient admits to having 6-8 loose stools with mucous within 24 hours of arrival to the ED. Upon arrival to the ED, patient admitted to fever and chills, but denied abdominal pain, nausea, vomiting, chest pain, and shortness of breath. Patient was found to have WBC 10.7, BUN 35, Creatinine 4.10, temperature 100.2, tachycardia, tachypnea, oxygen saturation 90-98% on room air, lactic acid 2.01, 0.99, and negative troponin. CT of abdomen and pelvis showed pancolitis.  Patient was admitted on the working diagnosis of sepsis and chronic diarrhea.   Assessment/Plan:   Principal Problem:   Diarrhea Active Problems:   End stage renal disease (HCC)   Essential hypertension   Dyslipidemia   Clostridium difficile colitis   Sepsis ([place])   Tobacco abuse   1. Treatment Failure for C. Difficile with Sepsis: Patient was treated for C. Difficile on 2/5 and was discharged to rehab with 9 days of oral vancomycin which was completed. Patient tested positive for C. Diff antigen and toxin on 04/29/17. Patient met sepsis criteria upon arrival to ED. Patient is currently afebrile with no tachycardia or tachypnea with lactic acid levels at  1.0 -Patient is still having loose stool frequently today -Switch vancomycin to Fidaxomicin under the assumption of failure to treat c. Difficile with previous course of oral vancomycin -Continue IVFs to prevent dehydration from chronic diarrhea -Imodium prn for diarrhea  2. End Stage Renal Disease and known right hydronephroureter: Patient does not want hemodialysis. Patient is showing signs of fluid overload with 2+ pitting edema bilaterally. -Switch from NPO to renal diet with fluid restriction of 1200 mL -Continue to monitor renal function with CMP tomorrow. Latest labs demonstrate BUN 39 and creatinine 4/05 -hold lasix temporarily -daily weights to monitor fluid overload  3. Hypertension: Patient's BP is still elevated and has been fluctuating 140-150s SBP. -hold lasix temporarily due to renal function -Continue Flomax (prescribed for BPH) and hydralazine PRN if SBP is elevated above 175.  4. Hyperlipidemia:  -continue pravastatin  5. Tobacco Abuse: -continue nicotine patch  6. Anemia due to ESRD:  Hemoglobin is trending down from 11.9 to 9.9 over past 24 hours. -Continue to monitor anemia with CBC -Continue iron supplements -transfuse patient if hemoglobin goes below 7  7. Thoughts of Suicide: Patient does not currently have thoughts of suicide but wishes he was dead. He has had thoughts of hurting himself over the past 8 days, but has never attempted. He does not currently have a plan and does not want to hurt himself right now. -consult psychiatry for evaluation -suicide precautions if patient meets protocol      Family Communication/Anticipated D/C date and plan/Code Status   DVT prophylaxis: SQ Heparin Code Status: DNR Family Communication: Family not present at bedside. Son was  called, but not reached Disposition Plan: Home   Medical Consultants:  Psychiatry PT OT  Antimicrobials:  Fidaxomicin 220m  Po BID   Subjective:  Patient is awake and alert. He  admits to still having multiple episodes of loose stool this morning with mucous. He states he only has abdominal pain if anyone pushes on his LLQ. He expressed that he was ready to die and did not want to undergo HD. He admits to thoughts of wanting to hurt himself over the last 8 days, but had no attempts and currently has no plan. Patient admits to having a left inguinal hernia that sometimes goes into his left scrotum that causes great difficulty pushing it back up. He denies vomiting, chest pain, shortness of breath.   Objective:   Vitals:   0Mar 06, [id number] 04/29/17 0505  BP: 122/63 122/63 (!) 149/58 (!) 146/64  Pulse: 67  61 62  Resp: 19 (!) _0 Temp:   99.1 F (37.3 C) 98.4 F (36.9 C)  TempSrc:      SpO2: 99%  97% 98%  Weight:   72.2 kg (159 lb 2.8 oz)   Height:   5' 6" (1.676 m)     Intake/Output Summary (Last 24 hours) at 04/29/2017 1315 Last data filed at 04/29/2017 0600 Gross per 24 hour  Intake 850 ml  Output 0 ml  Net 850 ml   Filed Weights   [id number]  Weight: 73 kg (161 lb) 72.2 kg (159 lb 2.8 oz)     Physical Exam:   Constitutional: NAD Neck: normal, supple, no masses Respiratory: clear to auscultation bilaterally, no wheezing, no crackles. Normal respiratory effort. No accessory muscle use.  Cardiovascular: Regular rate and rhythm, normal S1-S2, no murmurs / rubs / gallops. 2+ pitting edema bilaterally. 2+ pedal pulses.  Abdomen: soft and nondistended. Bowel sounds positive. Tenderness to palpation in LLQ. No rebound tenderness. Hernia present in left inguinal region. Musculoskeletal: no clubbing / cyanosis. No joint deformity upper and lower extremities. Normal muscle tone.  Skin: no rashes, lesions, ulcers. No induration Psychiatric: Alert and oriented x 3    Data Reviewed:   I have independently reviewed following labs and imaging studies:   CBC: Recent Labs  Lab [id number]  04/29/17 0033  WBC  --  10.7* 10.4  NEUTROABS  --  8.7*  --   HGB 11.9* 10.1* 9.9*  HCT 35.0* 31.1* 31.5*  MCV  --  93.7 92.4  PLT  --  174 1416  Basic Metabolic Panel: Recent Labs  Lab [id number] 04/29/17 0033  NA 142 142 138  K 5.0 4.9 3.9  CL 113* 112* 111  CO2 17*  --  19*  GLUCOSE 101* 98 117*  BUN 42* 35* 39*  CREATININE 4.11* 4.10* 4.05*  CALCIUM 8.2*  --  7.3*   GFR: Estimated Creatinine Clearance: 12.3 mL/min (A) (by C-G formula based on SCr of 4.05 mg/dL (H)). Liver Function Tests: No results for input(s): AST, ALT, ALKPHOS, BILITOT, PROT, ALBUMIN in the last 168 hours. No results for input(s): LIPASE, AMYLASE in the last 168 hours. No results for input(s): AMMONIA in the last 168 hours. Coagulation Profile: No results for input(s): INR, PROTIME in the last 168 hours. Cardiac Enzymes: No results for input(s): CKTOTAL, CKMB, CKMBINDEX, TROPONINI in the last 168 hours. BNP (last 3 results) No results for input(s): PROBNP in the last 8760 hours. HbA1C: No results  for input(s): HGBA1C in the last 72 hours. CBG: No results for input(s): GLUCAP in the last 168 hours. Lipid Profile: No results for input(s): CHOL, HDL, LDLCALC, TRIG, CHOLHDL, LDLDIRECT in the last 72 hours. Thyroid Function Tests: No results for input(s): TSH, T4TOTAL, FREET4, T3FREE, THYROIDAB in the last 72 hours. Anemia Panel: No results for input(s): VITAMINB12, FOLATE, FERRITIN, TIBC, IRON, RETICCTPCT in the last 72 hours. Urine analysis:    Component Value Date/Time   COLORURINE YELLOW 04/09/2017 1947   APPEARANCEUR CLEAR 04/09/2017 1947   LABSPEC 1.013 04/09/2017 1947   PHURINE 6.0 04/09/2017 1947   GLUCOSEU 150 (A) 04/09/2017 1947   HGBUR MODERATE (A) 04/09/2017 [place] NEGATIVE 04/09/2017 1947   KETONESUR 5 (A) 04/09/2017 1947   PROTEINUR >=300 (A) 04/09/2017 1947   NITRITE NEGATIVE 04/09/2017 1947   LEUKOCYTESUR NEGATIVE 04/09/2017 1947   Sepsis  Labs: Invalid input(s): PROCALCITONIN, LACTICIDVEN  Recent Results (from the past 240 hour(s))  C difficile quick scan w PCR reflex     Status: Abnormal   Collection Time: 04/29/17  1:16 AM  Result Value Ref Range Status   C Diff antigen POSITIVE (A) NEGATIVE Final   C Diff toxin POSITIVE (A) NEGATIVE Final   C Diff interpretation Toxin producing C. difficile detected.  Final    Comment: CRITICAL RESULT CALLED TO, READ BACK BY AND VERIFIED WITH: Marcheta Grammes RN 04/29/17 0551 JDW Performed at [facility], [address] [place], [place] [address]       Radiology Studies: Ct Abdomen Pelvis Wo Contrast  Result Date: 04/28/2017 CLINICAL DATA:  84 year old male with abdominal pain. Concern for colitis or gastroenteritis. EXAM: CT ABDOMEN AND PELVIS WITHOUT CONTRAST TECHNIQUE: Multidetector CT imaging of the abdomen and pelvis was performed following the standard protocol without IV contrast. COMPARISON:  Abdominal CT dated 04/09/2017 FINDINGS: Evaluation of this exam is limited in the absence of intravenous contrast. Lower chest: Partially visualized small right and probable trace left pleural effusion. There is hypoattenuation of the cardiac blood pool suggestive of a degree of anemia. Clinical correlation is recommended. There is coronary vascular calcification. No intra-abdominal free air or free fluid. Hepatobiliary: The liver is unremarkable. No intrahepatic biliary ductal dilatation. There is probable small amount of sludge within the gallbladder. No calcified stone. No pericholecystic fluid. Pancreas: Unremarkable. No pancreatic ductal dilatation or surrounding inflammatory changes. Spleen: Normal in size without focal abnormality. Adrenals/Urinary Tract: The adrenal glands are unremarkable. There is mild right hydronephroureter. No obstructing stone. The left kidney is unremarkable the urinary bladder is partially distended and appears grossly unremarkable for the degree of distention  Stomach/Bowel: There is diffuse thickened and inflamed appearance of the colon consistent with pancolitis. Correlation with clinical exam and stool cultures recommended there is no bowel obstruction. The appendix is normal. Vascular/Lymphatic: Advanced aortoiliac atherosclerotic disease. No portal venous gas. There is no adenopathy. Reproductive: The prostate gland is enlarged and somewhat asymmetric and nodular. It measures approximately 6 cm in transverse diameter. Clinical correlation is recommended. Other: There is diffuse stranding of the mesentery and subcutaneous edema. Small amount of fluid noted in the left inguinal canal. Musculoskeletal: Degenerative changes of the spine. No acute osseous pathology. IMPRESSION: 1. Pancolitis. Correlation with clinical exam and stool cultures recommended. No bowel obstruction. Normal appendix. 2. Partially visualized small pleural effusions. Diffuse mesenteric edema, and anasarca 3. Anemia. 4. Enlarged prostate gland. 5. Mild right hydronephroureter relatively similar to prior CT. 6. Advanced Aortic Atherosclerosis (ICD10-I70.0). Electronically Signed  By: Anner Crete M.D.   On: 04/28/2017 23:00      Medication:   . calcium acetate  667 mg Oral TID WC  . ferrous sulfate  325 mg Oral BID  . fidaxomicin  200 mg Oral BID  . finasteride  5 mg Oral Daily  . heparin  5,000 Units Subcutaneous Q8H  . nicotine  21 mg Transdermal Daily  . pravastatin  20 mg Oral q1800  . sodium bicarbonate  1,300 mg Oral BID  . tamsulosin  0.4 mg Oral Daily  . triamcinolone cream  1 application Topical BID  . vitamin E  100 Units Oral Daily    Continuous Infusions: . sodium chloride 50 mL/hr at 04/29/17 0012      Time spent: 30 minutes  Signed, Lanna Poche, PA-S [employer] 2020 Email: [email or web address] .Bennie Pierini

## 2017-04-29 NOTE — Care Management Note (Addendum)
Case Management Note  Patient Details  Name: Marcus Lane MRN: [id number] Date of Birth: 01-27-1932  Subjective/Objective:   Admitted for recurrent C-diff. Recently [facility] from [facility] s/p IV Vanc, home 1 day then return to hospital.               Action/Plan: CT of abdomen and pelvis showed pancolitis.  ESRD (refused HD).  start oral vancomycin. Verbal request given to CSW "[name]" Per Attending Dr. Cruzita Lederer requested that CSW do benefit check re: days available if requires re-admission into SNF rehab facility post discharge.    In-House Referral:   CSW  Discharge planning Services  CM Consult  Status of Service:  In process, will continue to follow  Additional Comments: Prior to admission patient lived at home, pt is independent for most of ADL.      Kristen Cardinal, RN  Nurse case manager [place] 04/29/2017, 11:25 AM

## 2017-04-29 NOTE — Progress Notes (Signed)
Sitter offered bath and patient stated they did not want one and that they were okay. Sitter has done peri care.

## 2017-04-30 DIAGNOSIS — E440 Moderate protein-calorie malnutrition: Secondary | ICD-10-CM

## 2017-04-30 DIAGNOSIS — F4321 Adjustment disorder with depressed mood: Secondary | ICD-10-CM

## 2017-04-30 LAB — BASIC METABOLIC PANEL
Anion gap: 9 (ref 5–15)
BUN: 39 mg/dL — ABNORMAL HIGH (ref 6–20)
CO2: 18 mmol/L — ABNORMAL LOW (ref 22–32)
Calcium: 7 mg/dL — ABNORMAL LOW (ref 8.9–10.3)
Chloride: 112 mmol/L — ABNORMAL HIGH (ref 101–111)
Creatinine, Ser: 3.83 mg/dL — ABNORMAL HIGH (ref 0.61–1.24)
GFR calc Af Amer: 15 mL/min — ABNORMAL LOW (ref >60)
GFR calc non Af Amer: 13 mL/min — ABNORMAL LOW (ref >60)
Glucose, Bld: 94 mg/dL (ref 65–99)
Potassium: 2.7 mmol/L — CL (ref 3.5–5.1)
Sodium: 139 mmol/L (ref 135–145)

## 2017-04-30 LAB — CBC
HCT: 29.8 % — ABNORMAL LOW (ref 39.0–52.0)
Hemoglobin: 9.3 g/dL — ABNORMAL LOW (ref 13.0–17.0)
MCH: 28.8 pg (ref 26.0–34.0)
MCHC: 31.2 g/dL (ref 30.0–36.0)
MCV: 92.3 fL (ref 78.0–100.0)
Platelets: 169 10*3/uL (ref 150–400)
RBC: 3.23 MIL/uL — ABNORMAL LOW (ref 4.22–5.81)
RDW: 17.3 % — ABNORMAL HIGH (ref 11.5–15.5)
WBC: 6.3 10*3/uL (ref 4.0–10.5)

## 2017-04-30 LAB — GLUCOSE, CAPILLARY: GLUCOSE-CAPILLARY: 88 mg/dL (ref 65–99)

## 2017-04-30 MED ORDER — POTASSIUM CHLORIDE 20 MEQ/15ML (10%) PO SOLN
40.0000 meq | Freq: Two times a day (BID) | ORAL | Status: DC
  Administered 2017-04-30 – 2017-05-01 (×3): 40 meq via ORAL
  Filled 2017-04-30 (×3): qty 30

## 2017-04-30 MED ORDER — METOPROLOL TARTRATE 12.5 MG HALF TABLET
12.5000 mg | ORAL_TABLET | Freq: Two times a day (BID) | ORAL | Status: DC
  Administered 2017-04-30 – 2017-05-01 (×3): 12.5 mg via ORAL
  Filled 2017-04-30 (×5): qty 1

## 2017-04-30 MED ORDER — METOPROLOL TARTRATE 5 MG/5ML IV SOLN
2.5000 mg | Freq: Once | INTRAVENOUS | Status: AC
  Administered 2017-04-30: 2.5 mg via INTRAVENOUS
  Filled 2017-04-30: qty 5

## 2017-04-30 MED ORDER — FUROSEMIDE 80 MG PO TABS
80.0000 mg | ORAL_TABLET | Freq: Every day | ORAL | Status: DC
  Administered 2017-04-30 – 2017-05-01 (×2): 80 mg via ORAL
  Filled 2017-04-30 (×2): qty 1

## 2017-04-30 MED ORDER — POTASSIUM CHLORIDE CRYS ER 20 MEQ PO TBCR
40.0000 meq | EXTENDED_RELEASE_TABLET | Freq: Two times a day (BID) | ORAL | Status: DC
  Filled 2017-04-30: qty 2

## 2017-04-30 MED ORDER — METOPROLOL TARTRATE 5 MG/5ML IV SOLN
2.5000 mg | INTRAVENOUS | Status: DC | PRN
  Administered 2017-05-02: 2.5 mg via INTRAVENOUS
  Filled 2017-04-30 (×2): qty 5

## 2017-04-30 NOTE — Progress Notes (Signed)
Paged Dr. Cruzita Lederer regarding pt in Afib with HR of 152, gave orders for Metoprolol 2.5 mg IV if HR more than 110.  Pts HR currently is 82, holding his metoprolol currently.  Will continue to monitor.

## 2017-04-30 NOTE — NC FL2 (Signed)
[place] MEDICAID FL2 LEVEL OF CARE SCREENING TOOL     IDENTIFICATION  Patient Name: Marcus Lane Birthdate: Mar 28, 1931 Sex: male Admission Date (Current Location): 04/28/2017  [facility] and [place] Number:  Herbalist and Address:  [facility], [employer] [address], [place], [place] [address]      Provider Number: [id number]  Attending Physician Name and Address:  Caren Griffins, MD  Relative Name and Phone Number:       Current Level of Care: Hospital Recommended Level of Care: [place] Prior Approval Number:    Date Approved/Denied:   PASRR Number: [id number]  Discharge Plan: SNF    Current Diagnoses: Patient Active Problem List   Diagnosis Date Noted  . Malnutrition of moderate degree 04/30/2017  . DNR (do not resuscitate) discussion   . Adjustment disorder with depressed mood   . Diarrhea 04/28/2017  . Sepsis ([place]) 04/28/2017  . Tobacco abuse 04/28/2017  . End stage renal disease ([place]) 04/14/2017  . History of poliomyelitis 04/14/2017  . Anemia of chronic disease 04/14/2017  . Essential hypertension 04/14/2017  . History of prostate cancer 04/14/2017  . Dyslipidemia 04/14/2017  . Colitis 04/09/2017  . Clostridium difficile enterocolitis 04/08/2017    Orientation RESPIRATION BLADDER Height & Weight     Situation, Place, Time, Self  Normal Continent Weight: 164 lb 3.9 oz (74.5 kg) Height:  5\' 6"  (167.6 cm)  BEHAVIORAL SYMPTOMS/MOOD NEUROLOGICAL BOWEL NUTRITION STATUS      Incontinent (Please see d/c summary)  AMBULATORY STATUS COMMUNICATION OF NEEDS Skin   Limited Assist Verbally Normal                       Personal Care Assistance Level of Assistance  Bathing, Feeding, Dressing Bathing Assistance: Limited assistance Feeding assistance: Independent Dressing Assistance: Limited assistance     Functional Limitations Info  Sight, Hearing, Speech Sight Info: Adequate Hearing Info: Impaired Speech  Info: Adequate    SPECIAL CARE FACTORS FREQUENCY  PT (By licensed PT), OT (By licensed OT)     PT Frequency: 5x OT Frequency: 5x            Contractures Contractures Info: Not present    Additional Factors Info  Code Status, Allergies Code Status Info: Full Code Allergies Info: No known allergies           Current Medications (04/30/2017):  This is the current hospital active medication list Current Facility-Administered Medications  Medication Dose Route Frequency Provider Last Rate Last Dose  . acetaminophen (TYLENOL) tablet 650 mg  650 mg Oral Q6H PRN Ivor Costa, MD       Or  . acetaminophen (TYLENOL) suppository 650 mg  650 mg Rectal Q6H PRN Ivor Costa, MD      . calcium acetate (PHOSLO) capsule 667 mg  667 mg Oral TID WC Ivor Costa, MD   667 mg at 04/30/17 1339  . feeding supplement (ENSURE ENLIVE) (ENSURE ENLIVE) liquid 237 mL  237 mL Oral BID BM Caren Griffins, MD   237 mL at 04/30/17 1339  . ferrous sulfate tablet 325 mg  325 mg Oral BID Ivor Costa, MD   325 mg at 04/30/17 1000  . fidaxomicin (DIFICID) tablet 200 mg  200 mg Oral BID Caren Griffins, MD   200 mg at 04/30/17 1000  . finasteride (PROSCAR) tablet 5 mg  5 mg Oral Daily Ivor Costa, MD   5 mg at 04/30/17 1000  .  furosemide (LASIX) tablet 80 mg  80 mg Oral Daily Caren Griffins, MD   80 mg at 04/30/17 1052  . heparin injection 5,000 Units  5,000 Units Subcutaneous Q8H Ivor Costa, MD   5,000 Units at 04/30/17 1339  . hydrALAZINE (APRESOLINE) injection 5 mg  5 mg Intravenous Q2H PRN Ivor Costa, MD      . metoprolol tartrate (LOPRESSOR) injection 2.5 mg  2.5 mg Intravenous Q5 min PRN Caren Griffins, MD      . metoprolol tartrate (LOPRESSOR) tablet 12.5 mg  12.5 mg Oral BID Caren Griffins, MD   12.5 mg at 04/30/17 1000  . nicotine (NICODERM CQ - dosed in mg/24 hours) patch 21 mg  21 mg Transdermal Daily Ivor Costa, MD   21 mg at 04/29/17 2225  . ondansetron (ZOFRAN) tablet 4 mg  4 mg Oral Q6H PRN  Ivor Costa, MD       Or  . ondansetron [facility]) injection 4 mg  4 mg Intravenous Q6H PRN Ivor Costa, MD   4 mg at 04/30/17 2094  . potassium chloride 20 MEQ/15ML (10%) solution 40 mEq  40 mEq Oral BID Caren Griffins, MD   40 mEq at 04/30/17 1052  . pravastatin (PRAVACHOL) tablet 20 mg  20 mg Oral q1800 Ivor Costa, MD   20 mg at 04/29/17 7096  . sodium bicarbonate tablet 1,300 mg  1,300 mg Oral BID Ivor Costa, MD   1,300 mg at 04/30/17 1000  . tamsulosin (FLOMAX) capsule 0.4 mg  0.4 mg Oral Daily Ivor Costa, MD   0.4 mg at 04/30/17 0959  . triamcinolone cream (KENALOG) 0.5 % 1 application  1 application Topical BID Ivor Costa, MD   1 application at 28/36/62 1003  . vitamin E capsule 100 Units  100 Units Oral Daily Ivor Costa, MD   100 Units at 04/30/17 1000  . zolpidem (AMBIEN) tablet 5 mg  5 mg Oral QHS PRN Ivor Costa, MD         Discharge Medications: Please see discharge summary for a list of discharge medications.  Relevant Imaging Results:  Relevant Lab Results:   Additional Information SSN: [id number]  Eileen Stanford, LCSW

## 2017-04-30 NOTE — Progress Notes (Signed)
PROGRESS NOTE  Marcus Lane EPP:[id number] DOB: [birth year] DOA: 04/28/2017 PCP: System, Pcp Not In   LOS: 2 days   Brief Narrative / Interim history: pleasant 84 year old male with history of chronic kidney disease stage V (refuses hemodialysis), hyperlipidemia, prostate cancer, tobacco abuse, who was recently hospitalized with C. difficile colitis and discharged on 2/5 on p.o. vancomycin, he went to an SNF and reportedly has completed a course but despite that he has had persistent diarrhea.  He was discharged from SNF home and was home for 1 day, and due to persistent loose watery bowel movements he presented back to the hospital.  Assessment & Plan: Principal Problem:   Adjustment disorder with depressed mood Active Problems:   End stage renal disease ([place])   Essential hypertension   Dyslipidemia   Clostridium difficile enterocolitis   Diarrhea   Sepsis ([place])   Tobacco abuse   DNR (do not resuscitate) discussion   Malnutrition of moderate degree   Sepsis due to C. difficile colitis -This likely represents failure treatment as it does not appear that patient has ever achieved good solid clinical response -Discussed with ID, will change to fidaxomicin -Seems to be improving today  Chronic kidney disease stage V -Baseline creatinine in the mid fours, currently he is at baseline.   -Resume Lasix today, stop IV fluids, will resume furosemide at a lower dose than home  Hypokalemia -Due to diarrhea, replete potassium today  Hypertension -Continue as needed hydralazine -  Suicidal ideation -Patient reports thoughts about "being dead", has thought about hurting himself about a week ago, however does not have a plan but does not appear to have an intention to do so.   -Consulted psychiatry, no imminent risk and no role for inpatient psychiatric hospitalization  Goals of care -he is DNR, does not wish to have hemodialysis  Hyperlipidemia -Continue statin   DVT  prophylaxis: heparin Code Status: DNR Family Communication: no family at bedside, unable to reach son Disposition Plan: SNF   Consultants:   None  Procedures:   None   Antimicrobials:  Fidaxomycin 2/22 >>  Subjective: - no chest pain, shortness of breath, no abdominal pain, nausea or vomiting.   Objective: Vitals:   04/29/17 2138 04/29/17 2315 04/30/17 0620 04/30/17 1008  BP: (!) 151/66 (!) 161/76 (!) 157/73 (!) 154/66  Pulse: 73 72 68 62  Resp: 18 18 18 18   Temp: 99.3 F (37.4 C) 98.4 F (36.9 C) 99.4 F (37.4 C) 98.4 F (36.9 C)  TempSrc: Oral Oral Oral Oral  SpO2: 97% 100% 98% 96%  Weight: 74.5 kg (164 lb 3.9 oz)     Height:        Intake/Output Summary (Last 24 hours) at 04/30/2017 1347 Last data filed at 04/30/2017 0600 Gross per 24 hour  Intake 1908 ml  Output 1725 ml  Net 183 ml   Filed Weights   04/28/17 1500 04/28/17 2357 04/29/17 2138  Weight: 73 kg (161 lb) 72.2 kg (159 lb 2.8 oz) 74.5 kg (164 lb 3.9 oz)    Examination:  Constitutional: NAD Eyes: PERRL, lids and conjunctivae normal Respiratory: clear to auscultation bilaterally, no wheezing, no crackles.  Cardiovascular: Regular rate and rhythm, no murmurs / rubs / gallops. 1+ LE edema.   Abdomen: no tenderness. Bowel sounds positive.  Skin: no rashes, lesions, ulcers. No induration Neurologic: CN 2-12 grossly intact. Strength 5/5 in all 4.  Psychiatric: Normal judgment and insight. Alert and oriented x 3. Normal mood.    Data Reviewed:  I have independently reviewed following labs and imaging studies   CBC: Recent Labs  Lab 04/28/17 1606 04/28/17 2109 04/29/17 0033 04/30/17 0516  WBC  --  10.7* 10.4 6.3  NEUTROABS  --  8.7*  --   --   HGB 11.9* 10.1* 9.9* 9.3*  HCT 35.0* 31.1* 31.5* 29.8*  MCV  --  93.7 92.4 92.3  PLT  --  174 174 086   Basic Metabolic Panel: Recent Labs  Lab 04/28/17 1537 04/28/17 1606 04/29/17 0033 04/30/17 0516  NA 142 142 138 139  K 5.0 4.9 3.9 2.7*    CL 113* 112* 111 112*  CO2 17*  --  19* 18*  GLUCOSE 101* 98 117* 94  BUN 42* 35* 39* 39*  CREATININE 4.11* 4.10* 4.05* 3.83*  CALCIUM 8.2*  --  7.3* 7.0*   GFR: Estimated Creatinine Clearance: 13 mL/min (A) (by C-G formula based on SCr of 3.83 mg/dL (H)). Liver Function Tests: No results for input(s): AST, ALT, ALKPHOS, BILITOT, PROT, ALBUMIN in the last 168 hours. No results for input(s): LIPASE, AMYLASE in the last 168 hours. No results for input(s): AMMONIA in the last 168 hours. Coagulation Profile: No results for input(s): INR, PROTIME in the last 168 hours. Cardiac Enzymes: No results for input(s): CKTOTAL, CKMB, CKMBINDEX, TROPONINI in the last 168 hours. BNP (last 3 results) No results for input(s): PROBNP in the last 8760 hours. HbA1C: No results for input(s): HGBA1C in the last 72 hours. CBG: Recent Labs  Lab 04/30/17 0740  GLUCAP 88   Lipid Profile: No results for input(s): CHOL, HDL, LDLCALC, TRIG, CHOLHDL, LDLDIRECT in the last 72 hours. Thyroid Function Tests: No results for input(s): TSH, T4TOTAL, FREET4, T3FREE, THYROIDAB in the last 72 hours. Anemia Panel: No results for input(s): VITAMINB12, FOLATE, FERRITIN, TIBC, IRON, RETICCTPCT in the last 72 hours. Urine analysis:    Component Value Date/Time   COLORURINE YELLOW 04/09/2017 1947   APPEARANCEUR CLEAR 04/09/2017 1947   LABSPEC 1.013 04/09/2017 1947   PHURINE 6.0 04/09/2017 1947   GLUCOSEU 150 (A) 04/09/2017 1947   HGBUR MODERATE (A) 04/09/2017 [place] NEGATIVE 04/09/2017 1947   KETONESUR 5 (A) 04/09/2017 1947   PROTEINUR >=300 (A) 04/09/2017 1947   NITRITE NEGATIVE 04/09/2017 1947   LEUKOCYTESUR NEGATIVE 04/09/2017 1947   Sepsis Labs: Invalid input(s): PROCALCITONIN, LACTICIDVEN  Recent Results (from the past 240 hour(s))  C difficile quick scan w PCR reflex     Status: Abnormal   Collection Time: 04/29/17  1:16 AM  Result Value Ref Range Status   C Diff antigen POSITIVE (A)  NEGATIVE Final   C Diff toxin POSITIVE (A) NEGATIVE Final   C Diff interpretation Toxin producing C. difficile detected.  Final    Comment: CRITICAL RESULT CALLED TO, READ BACK BY AND VERIFIED WITH: Marcheta Grammes RN 04/29/17 0551 JDW Performed at [facility], [address], [place], [place] [address]       Radiology Studies: Ct Abdomen Pelvis Wo Contrast  Result Date: 04/28/2017 CLINICAL DATA:  84 year old male with abdominal pain. Concern for colitis or gastroenteritis. EXAM: CT ABDOMEN AND PELVIS WITHOUT CONTRAST TECHNIQUE: Multidetector CT imaging of the abdomen and pelvis was performed following the standard protocol without IV contrast. COMPARISON:  Abdominal CT dated 04/09/2017 FINDINGS: Evaluation of this exam is limited in the absence of intravenous contrast. Lower chest: Partially visualized small right and probable trace left pleural effusion. There is hypoattenuation of the cardiac blood pool suggestive of a  degree of anemia. Clinical correlation is recommended. There is coronary vascular calcification. No intra-abdominal free air or free fluid. Hepatobiliary: The liver is unremarkable. No intrahepatic biliary ductal dilatation. There is probable small amount of sludge within the gallbladder. No calcified stone. No pericholecystic fluid. Pancreas: Unremarkable. No pancreatic ductal dilatation or surrounding inflammatory changes. Spleen: Normal in size without focal abnormality. Adrenals/Urinary Tract: The adrenal glands are unremarkable. There is mild right hydronephroureter. No obstructing stone. The left kidney is unremarkable the urinary bladder is partially distended and appears grossly unremarkable for the degree of distention Stomach/Bowel: There is diffuse thickened and inflamed appearance of the colon consistent with pancolitis. Correlation with clinical exam and stool cultures recommended there is no bowel obstruction. The appendix is normal. Vascular/Lymphatic: Advanced  aortoiliac atherosclerotic disease. No portal venous gas. There is no adenopathy. Reproductive: The prostate gland is enlarged and somewhat asymmetric and nodular. It measures approximately 6 cm in transverse diameter. Clinical correlation is recommended. Other: There is diffuse stranding of the mesentery and subcutaneous edema. Small amount of fluid noted in the left inguinal canal. Musculoskeletal: Degenerative changes of the spine. No acute osseous pathology. IMPRESSION: 1. Pancolitis. Correlation with clinical exam and stool cultures recommended. No bowel obstruction. Normal appendix. 2. Partially visualized small pleural effusions. Diffuse mesenteric edema, and anasarca 3. Anemia. 4. Enlarged prostate gland. 5. Mild right hydronephroureter relatively similar to prior CT. 6. Advanced Aortic Atherosclerosis (ICD10-I70.0). Electronically Signed   By: Anner Crete M.D.   On: 04/28/2017 23:00     Scheduled Meds: . calcium acetate  667 mg Oral TID WC  . feeding supplement (ENSURE ENLIVE)  237 mL Oral BID BM  . ferrous sulfate  325 mg Oral BID  . fidaxomicin  200 mg Oral BID  . finasteride  5 mg Oral Daily  . furosemide  80 mg Oral Daily  . heparin  5,000 Units Subcutaneous Q8H  . metoprolol tartrate  12.5 mg Oral BID  . nicotine  21 mg Transdermal Daily  . potassium chloride  40 mEq Oral BID  . pravastatin  20 mg Oral q1800  . sodium bicarbonate  1,300 mg Oral BID  . tamsulosin  0.4 mg Oral Daily  . triamcinolone cream  1 application Topical BID  . vitamin E  100 Units Oral Daily   Continuous Infusions:   Marzetta Board, MD, PhD [facility] Pager [phone]  If 7PM-7AM, please contact night-coverage [email or web address] Password [place] 04/30/2017, 1:47 PM

## 2017-05-01 ENCOUNTER — Other Ambulatory Visit: Payer: Self-pay

## 2017-05-01 LAB — BASIC METABOLIC PANEL
ANION GAP: 10 (ref 5–15)
BUN: 39 mg/dL — ABNORMAL HIGH (ref 6–20)
CALCIUM: 7.1 mg/dL — AB (ref 8.9–10.3)
CO2: 18 mmol/L — ABNORMAL LOW (ref 22–32)
Chloride: 114 mmol/L — ABNORMAL HIGH (ref 101–111)
Creatinine, Ser: 3.99 mg/dL — ABNORMAL HIGH (ref 0.61–1.24)
GFR calc Af Amer: 15 mL/min — ABNORMAL LOW (ref >60)
GFR calc non Af Amer: 13 mL/min — ABNORMAL LOW (ref >60)
GLUCOSE: 94 mg/dL (ref 65–99)
POTASSIUM: 3.3 mmol/L — AB (ref 3.5–5.1)
Sodium: 142 mmol/L (ref 135–145)

## 2017-05-01 LAB — CBC
HCT: 31.1 % — ABNORMAL LOW (ref 39.0–52.0)
Hemoglobin: 9.9 g/dL — ABNORMAL LOW (ref 13.0–17.0)
MCH: 30 pg (ref 26.0–34.0)
MCHC: 31.8 g/dL (ref 30.0–36.0)
MCV: 94.2 fL (ref 78.0–100.0)
Platelets: 170 10*3/uL (ref 150–400)
RBC: 3.3 MIL/uL — ABNORMAL LOW (ref 4.22–5.81)
RDW: 17.6 % — AB (ref 11.5–15.5)
WBC: 6.5 10*3/uL (ref 4.0–10.5)

## 2017-05-01 LAB — GLUCOSE, CAPILLARY: Glucose-Capillary: 91 mg/dL (ref 65–99)

## 2017-05-01 MED ORDER — FUROSEMIDE 80 MG PO TABS
80.0000 mg | ORAL_TABLET | Freq: Two times a day (BID) | ORAL | Status: DC
  Administered 2017-05-01 – 2017-05-02 (×3): 80 mg via ORAL
  Filled 2017-05-01 (×3): qty 1

## 2017-05-01 NOTE — Clinical Social Work Note (Signed)
CSW provided pt's son with list of bed offers we have this far. CSW provided son with CSW number for tomorrow to get an update on additional offers.  [place], [place]

## 2017-05-01 NOTE — Progress Notes (Signed)
Patient heart rate in the 50s. Bedtime dose metoprolol held. Notified on call provider of holding medication. Will continue to monitor. Bartholomew Crews, RN

## 2017-05-01 NOTE — Progress Notes (Signed)
OT NOTE  Pt currently declining HD per chart and SNF recommendation. Spoke with RN regarding evaluation today and requested to hold at this time. Recommendation for Palliative consult if patient further declines HD or SNF placement. OT to continue to follow for updates.   Jeri Modena   OTR/L Pager: [phone] Office: [phone] .

## 2017-05-01 NOTE — Clinical Social Work Note (Signed)
Clinical Social Work Assessment  Patient Details  Name: Marcus Lane MRN: [id number] Date of Birth: [birth year]  Date of referral:  05/01/17               Reason for consult:  Facility Placement                Permission sought to share information with:  Family Supports Permission granted to share information::  Yes, Verbal Permission Granted  Name::     Drago Hammonds  Agency::     Relationship::  Son  Sport and exercise psychologist Information:     Housing/Transportation Living arrangements for the past 2 months:  [place] of Information:  Patient, Adult Children Patient Interpreter Needed:  None Criminal Activity/Legal Involvement Pertinent to Current Situation/Hospitalization:  No - Comment as needed Significant Relationships:  Adult Children Lives with:  Self Do you feel safe going back to the place where you live?    Need for family participation in patient care:     Care giving concerns:  Pt is alert and orriented. Pt was living at home, independently prior to admission.    Social Worker assessment / plan:  CSW spoke with pt at bedside. Pt's two sons from [place] were present at bedside. Pt's sons were inquiring about getting pt to [place] for long term. Pt's sons aware pt needs SNF at this time. Pt and pt's son are agreeable to [facility] F/[facility]. Pt has been to [facility] in the past and does not want to go back there. CSW will follow up with pt.  Employment status:  Retired Nurse, adult PT Recommendations:  [place] / Referral to community resources:  [place]  Patient/Family's Response to care:  Pt verbalized understanding of CSW role and expressed appreciation for support. Pt denies any concern regarding pt care at this time.   Patient/Family's Understanding of and Emotional Response to Diagnosis, Current Treatment, and Prognosis:  Pt understanding and realistic regarding physical limitations. Pt understands  the need for SNF placement at d/c. Pt agreeable to SNF placement at d/c, at this time. Pt's responses emotionally appropriate during conversation with CSW. Pt denies any concern regarding treatment plan at this time. CSW will continue to provide support and facilitate d/c needs.   Emotional Assessment Appearance:  Appears stated age Attitude/Demeanor/Rapport:  (Patient was appropriate.) Affect (typically observed):  Accepting, Appropriate, Calm Orientation:  Oriented to Self, Oriented to Place, Oriented to  Time, Oriented to Situation Alcohol / Substance use:  Not Applicable Psych involvement (Current and /or in the community):  No (Comment)  Discharge Needs  Concerns to be addressed:  Care Coordination, Basic Needs Readmission within the last 30 days:  Yes Current discharge risk:  Dependent with Mobility Barriers to Discharge:  Continued Medical Work up   [employer], LCSW 05/01/2017, 10:21 AM

## 2017-05-01 NOTE — Progress Notes (Addendum)
PROGRESS NOTE  Marcus Lane OMV:[id number] DOB: 01-25-1932 DOA: 04/28/2017 PCP: System, Pcp Not In   LOS: 3 days   Brief Narrative / Interim history: pleasant 84 year old male with history of chronic kidney disease stage V (refuses hemodialysis), hyperlipidemia, prostate cancer, tobacco abuse, who was recently hospitalized with C. difficile colitis and discharged on 2/5 on p.o. vancomycin, he went to an SNF and reportedly has completed a course but despite that he has had persistent diarrhea.  He was discharged from SNF home and was home for 1 day, and due to persistent loose watery bowel movements he presented back to the hospital.  Assessment & Plan: Principal Problem:   Adjustment disorder with depressed mood Active Problems:   End stage renal disease ([place])   Essential hypertension   Dyslipidemia   Clostridium difficile enterocolitis   Diarrhea   Sepsis ([place])   Tobacco abuse   DNR (do not resuscitate) discussion   Malnutrition of moderate degree   Sepsis due to C. difficile colitis -This likely represents failure treatment as it does not appear that patient has ever achieved good solid clinical response -Discussed with ID, will change to fidaxomicin -He seems to be improving and he is having less diarrhea now, stools are becoming more formed  Chronic kidney disease stage V -Baseline creatinine in the mid fours, currently he is at baseline.   -Resume Lasix yesterday at a lower dose, will increase to home dose today, creatinine overall stable  Hypokalemia -Due to diarrhea, replete potassium today and recheck tomorrow morning  Hypertension -Continue as needed hydralazine  Suicidal ideation -Patient reports thoughts about "being dead", has thought about hurting himself about a week ago, however does not have a plan but does not appear to have an intention to do so.   -Consulted psychiatry, no imminent risk and no role for inpatient psychiatric hospitalization  Goals of  care -he is DNR, does not wish to have hemodialysis.  His 2 sons who presents today, they drove from [place].  They understand their father's wishes however they are hopeful that his kidney function will hold on for as long as possible  Hyperlipidemia -Continue statin   DVT prophylaxis: heparin Code Status: DNR Family Communication: Discussed with patient's sons at bedside Disposition Plan: SNF   Consultants:   None  Procedures:   None   Antimicrobials:  Fidaxomycin 2/22 >>  Subjective: -Feels better, has less diarrhea, appreciates that overall he is improving.  He has noticed that his legs are swelling  Objective: Vitals:   04/30/17 1632 04/30/17 2253 05/01/17 0546 05/01/17 0900  BP: (!) 143/64 (!) 145/57 (!) 159/71 (!) 171/67  Pulse: 61 (!) 55 65 62  Resp: 18  18 18   Temp: 98.4 F (36.9 C) 98.2 F (36.8 C) 99.2 F (37.3 C) 98.6 F (37 C)  TempSrc: Oral Oral Oral Oral  SpO2: 96% 100%  98%  Weight:  74.4 kg (164 lb 0.4 oz)    Height:        Intake/Output Summary (Last 24 hours) at 05/01/2017 1500 Last data filed at 05/01/2017 0600 Gross per 24 hour  Intake 0 ml  Output 0 ml  Net 0 ml   Filed Weights   04/28/17 2357 04/29/17 2138 04/30/17 2253  Weight: 72.2 kg (159 lb 2.8 oz) 74.5 kg (164 lb 3.9 oz) 74.4 kg (164 lb 0.4 oz)    Examination:  Constitutional: No distress, pleasant gentleman Eyes: No scleral icterus Respiratory: Clear to auscultation without wheezing Cardiovascular: Regular rate and  rhythm, no murmurs 1+ pitting lower extremity edema Abdomen: Soft, nontender, nondistended Skin: No rashes Neurologic: Nonfocal   Data Reviewed: I have independently reviewed following labs and imaging studies   CBC: Recent Labs  Lab 04/28/17 1606 04/28/17 2109 04/29/17 0033 04/30/17 0516 05/01/17 0510  WBC  --  10.7* 10.4 6.3 6.5  NEUTROABS  --  8.7*  --   --   --   HGB 11.9* 10.1* 9.9* 9.3* 9.9*  HCT 35.0* 31.1* 31.5* 29.8* 31.1*  MCV  --  93.7  92.4 92.3 94.2  PLT  --  174 174 169 384   Basic Metabolic Panel: Recent Labs  Lab 04/28/17 1537 04/28/17 1606 04/29/17 0033 04/30/17 0516 05/01/17 0647  NA 142 142 138 139 142  K 5.0 4.9 3.9 2.7* 3.3*  CL 113* 112* 111 112* 114*  CO2 17*  --  19* 18* 18*  GLUCOSE 101* 98 117* 94 94  BUN 42* 35* 39* 39* 39*  CREATININE 4.11* 4.10* 4.05* 3.83* 3.99*  CALCIUM 8.2*  --  7.3* 7.0* 7.1*   GFR: Estimated Creatinine Clearance: 12.4 mL/min (A) (by C-G formula based on SCr of 3.99 mg/dL (H)). Liver Function Tests: No results for input(s): AST, ALT, ALKPHOS, BILITOT, PROT, ALBUMIN in the last 168 hours. No results for input(s): LIPASE, AMYLASE in the last 168 hours. No results for input(s): AMMONIA in the last 168 hours. Coagulation Profile: No results for input(s): INR, PROTIME in the last 168 hours. Cardiac Enzymes: No results for input(s): CKTOTAL, CKMB, CKMBINDEX, TROPONINI in the last 168 hours. BNP (last 3 results) No results for input(s): PROBNP in the last 8760 hours. HbA1C: No results for input(s): HGBA1C in the last 72 hours. CBG: Recent Labs  Lab 04/30/17 0740 05/01/17 0743  GLUCAP 88 91   Lipid Profile: No results for input(s): CHOL, HDL, LDLCALC, TRIG, CHOLHDL, LDLDIRECT in the last 72 hours. Thyroid Function Tests: No results for input(s): TSH, T4TOTAL, FREET4, T3FREE, THYROIDAB in the last 72 hours. Anemia Panel: No results for input(s): VITAMINB12, FOLATE, FERRITIN, TIBC, IRON, RETICCTPCT in the last 72 hours. Urine analysis:    Component Value Date/Time   COLORURINE YELLOW 04/09/2017 1947   APPEARANCEUR CLEAR 04/09/2017 1947   LABSPEC 1.013 04/09/2017 1947   PHURINE 6.0 04/09/2017 1947   GLUCOSEU 150 (A) 04/09/2017 1947   HGBUR MODERATE (A) 04/09/2017 [place] NEGATIVE 04/09/2017 1947   KETONESUR 5 (A) 04/09/2017 1947   PROTEINUR >=300 (A) 04/09/2017 1947   NITRITE NEGATIVE 04/09/2017 1947   LEUKOCYTESUR NEGATIVE 04/09/2017 1947   Sepsis  Labs: Invalid input(s): PROCALCITONIN, LACTICIDVEN  Recent Results (from the past 240 hour(s))  Culture, blood (x 2)     Status: None (Preliminary result)   Collection Time: 04/28/17  9:13 PM  Result Value Ref Range Status   Specimen Description BLOOD RIGHT ANTECUBITAL  Final   Special Requests   Final    BOTTLES DRAWN AEROBIC AND ANAEROBIC Blood Culture adequate volume   Culture   Final    NO GROWTH 1 DAY Performed at [facility], [address], [place], [place] [address]    Report Status PENDING  Incomplete  Culture, blood (x 2)     Status: None (Preliminary result)   Collection Time: 04/28/17 10:50 PM  Result Value Ref Range Status   Specimen Description BLOOD RIGHT WRIST  Final   Special Requests   Final    BOTTLES DRAWN AEROBIC AND ANAEROBIC Blood Culture adequate volume  Culture   Final    NO GROWTH 1 DAY Performed at [facility], [place] [address] [place], [place] [address]    Report Status PENDING  Incomplete  C difficile quick scan w PCR reflex     Status: Abnormal   Collection Time: 04/29/17  1:16 AM  Result Value Ref Range Status   C Diff antigen POSITIVE (A) NEGATIVE Final   C Diff toxin POSITIVE (A) NEGATIVE Final   C Diff interpretation Toxin producing C. difficile detected.  Final    Comment: CRITICAL RESULT CALLED TO, READ BACK BY AND VERIFIED WITH: Marcheta Grammes RN 04/29/17 0551 JDW Performed at [facility], [address] [place], [place] [address]       Radiology Studies: No results found.   Scheduled Meds: . calcium acetate  667 mg Oral TID WC  . feeding supplement (ENSURE ENLIVE)  237 mL Oral BID BM  . ferrous sulfate  325 mg Oral BID  . fidaxomicin  200 mg Oral BID  . finasteride  5 mg Oral Daily  . furosemide  80 mg Oral BID  . heparin  5,000 Units Subcutaneous Q8H  . metoprolol tartrate  12.5 mg Oral BID  . nicotine  21 mg Transdermal Daily  . potassium chloride  40 mEq Oral BID  . pravastatin  20 mg Oral q1800  . sodium  bicarbonate  1,300 mg Oral BID  . tamsulosin  0.4 mg Oral Daily  . triamcinolone cream  1 application Topical BID  . vitamin E  100 Units Oral Daily   Continuous Infusions:   Time spent: 25 minutes, more than 50% of bedside involved in counseling/discussions with patient's sons regarding current medical problems as well as further goals of care given advanced renal failure patient's wishes for no dialysis   Marzetta Board, MD, PhD [facility] Pager [phone]  If 7PM-7AM, please contact night-coverage [email or web address] Password [email or web address] 05/01/2017, 3:00 PM

## 2017-05-02 DIAGNOSIS — R55 Syncope and collapse: Secondary | ICD-10-CM

## 2017-05-02 DIAGNOSIS — E785 Hyperlipidemia, unspecified: Secondary | ICD-10-CM

## 2017-05-02 LAB — BASIC METABOLIC PANEL
Anion gap: 9 (ref 5–15)
BUN: 39 mg/dL — ABNORMAL HIGH (ref 6–20)
CALCIUM: 7.5 mg/dL — AB (ref 8.9–10.3)
CO2: 20 mmol/L — AB (ref 22–32)
CREATININE: 3.99 mg/dL — AB (ref 0.61–1.24)
Chloride: 113 mmol/L — ABNORMAL HIGH (ref 101–111)
GFR calc non Af Amer: 13 mL/min — ABNORMAL LOW (ref >60)
GFR, EST AFRICAN AMERICAN: 15 mL/min — AB (ref >60)
GLUCOSE: 104 mg/dL — AB (ref 65–99)
Potassium: 4 mmol/L (ref 3.5–5.1)
Sodium: 142 mmol/L (ref 135–145)

## 2017-05-02 LAB — GLUCOSE, CAPILLARY: Glucose-Capillary: 81 mg/dL (ref 65–99)

## 2017-05-02 MED ORDER — ASPIRIN EC 81 MG PO TBEC: 81.0000 mg | DELAYED_RELEASE_TABLET | Freq: Every day | ORAL | 2 refills | Status: DC

## 2017-05-02 MED ORDER — FIDAXOMICIN 200 MG PO TABS: 200.0000 mg | ORAL_TABLET | Freq: Two times a day (BID) | ORAL | Status: DC

## 2017-05-02 MED ORDER — METOPROLOL TARTRATE 25 MG PO TABS: 25.0000 mg | ORAL_TABLET | Freq: Two times a day (BID) | ORAL | Status: DC

## 2017-05-02 MED ORDER — METOPROLOL TARTRATE 25 MG PO TABS
25.0000 mg | ORAL_TABLET | Freq: Two times a day (BID) | ORAL | Status: DC
  Administered 2017-05-02: 25 mg via ORAL

## 2017-05-02 NOTE — Discharge Summary (Signed)
Physician Discharge Summary  Marcus Lane UUV:[id number] DOB: [birth year] DOA: 04/28/2017  PCP: System, Pcp Not In  Admit date: 04/28/2017 Discharge date: 05/02/2017  Admitted From: home Disposition:  SNF  Recommendations for Outpatient Follow-up:  1. Follow up with PCP in 1-2 weeks 2. Continue Fidaxomycin for 6 more days for C diff. Patient failed Vancomycin   Home Health: none Equipment/Devices: none  Discharge Condition: stable CODE STATUS: DNR Diet recommendation: renal   HPI: Per Dr. Blaine Hamper, Marcus Lane is a 84 y.o. male with medical history significant of ESRD (refused HD), hyperlipidemia, prostate cancer, iron deficiency anemia, tobacco abuse, polio, C. difficile colitis, who presents with diarrhea and generalized weakness. Patient was recently hospitalized from 02/2-2/5 due to diarrhea, found to have positive C. difficile colitis. Patient was discharged to rehabilitation facility on vancomycin. Patient states that he completed 9 days of oral vancomycin. His diarrhea has improved. He was discharged home yesterday, but after he went home, his diarrhea has worsened. He has had 8 times of loose stool bowel movement since yesterday. Patient denies abdominal pain, nausea vomiting, but he has abdominal tenderness on physical examination. He has fever and chills. Patient does not have chest pain, shortness breath, cough, symptoms of UTI or unilateral weakness.Patient has a generalized weakness, particularly in both legs. Patient states that he was seen in pharmacy, and his legs gave away. He was found to have tachycardia with heart rate up to 160 by EMS. ED Course: pt was found to have WBC 10.7, stable renal function, temperature 100.2, tachycardia, tachypnea, oxygen saturation 90-98% on room air. Lactic acid 2.01, 0.99, negative troponin, CT of abdomen and pelvis showed pancolitis. Patient is admitted to telemetry bed as inpatient.  Hospital Course: Sepsis due to C. difficile colitis -This  likely represents Vancomycin failure treatment as it does not appear that patient has ever achieved good solid clinical response. Discussed with ID, will change to fidaxomicin.  He is improving, his diarrhea has resolved Chronic kidney disease stage V -Baseline creatinine in the mid fours, currently he is at baseline.  He is tolerating Lasix Hypokalemia -Due to diarrhea, repleted and now within normal limits Hypertension -continue home medications Suicidal ideation -Patient reports thoughts about "being dead", has thought about hurting himself about a week ago, however does not have a plan nor an intention to do so. Consulted psychiatry, no imminent risk and no role for inpatient psychiatric hospitalization Goals of care -he is DNR, does not wish to have hemodialysis.  His 2 sons visited from [place], aware of patient's wishes Hyperlipidemia -Continue statin Paroxysmal atrial fibrillation-patient with few instances of A. fib on the monitor, he was started on metoprolol and now maintaining sinus.  Patient's CHA2DS2-VASc Score for Stroke Risk is > 2.  Discussed anticoagulation with patient, he will likely need Coumadin, however after discussing risks and benefits for now patient prefers not to be on anticoagulation given risk of falls.  Will place on aspirin.   Discharge Diagnoses:  Principal Problem:   Adjustment disorder with depressed mood Active Problems:   End stage renal disease ([place])   Essential hypertension   Dyslipidemia   Clostridium difficile enterocolitis   Diarrhea   Sepsis ([place])   Tobacco abuse   DNR (do not resuscitate) discussion   Malnutrition of moderate degree   Discharge Instructions   Allergies as of 05/02/2017   No Known Allergies     Medication List    TAKE these medications   aspirin EC 81 MG tablet Take 1 tablet (  81 mg total) by mouth daily.   calcium acetate 667 MG capsule Commonly known as:  PHOSLO Take 667 mg by mouth 3 (three) times daily.   ferrous  sulfate 325 (65 FE) MG tablet Take 325 mg by mouth 2 (two) times daily with a meal.   fidaxomicin 200 MG Tabs tablet Commonly known as:  DIFICID Take 1 tablet (200 mg total) by mouth 2 (two) times daily. For 6 days, end date 05/08/2017   finasteride 5 MG tablet Commonly known as:  PROSCAR Take 5 mg by mouth daily.   furosemide 80 MG tablet Commonly known as:  LASIX Take 80 mg by mouth 3 (three) times daily.   lovastatin 20 MG tablet Commonly known as:  MEVACOR Take 20 mg by mouth at bedtime.   metoprolol tartrate 25 MG tablet Commonly known as:  LOPRESSOR Take 1 tablet (25 mg total) by mouth 2 (two) times daily.   potassium chloride 20 MEQ/15ML (10%) Soln Take 20 mEq by mouth 2 (two) times daily.   sodium bicarbonate 650 MG tablet Take 1,300 mg by mouth 2 (two) times daily.   tamsulosin 0.4 MG Caps capsule Commonly known as:  FLOMAX Take 0.4 mg by mouth daily.   triamcinolone cream 0.5 % Commonly known as:  KENALOG Apply 1 application topically 2 (two) times daily.   VITAMIN E PO Take 1 capsule by mouth daily.      Contact information for after-discharge care    Destination    [facility] .   Service:  Skilled Nursing Contact information: [place] [phone]              Consultations:  None   Procedures/Studies:  Ct Abdomen Pelvis Wo Contrast  Result Date: 04/28/2017 CLINICAL DATA:  84 year old male with abdominal pain. Concern for colitis or gastroenteritis. EXAM: CT ABDOMEN AND PELVIS WITHOUT CONTRAST TECHNIQUE: Multidetector CT imaging of the abdomen and pelvis was performed following the standard protocol without IV contrast. COMPARISON:  Abdominal CT dated 04/09/2017 FINDINGS: Evaluation of this exam is limited in the absence of intravenous contrast. Lower chest: Partially visualized small right and probable trace left pleural effusion. There is hypoattenuation of the cardiac  blood pool suggestive of a degree of anemia. Clinical correlation is recommended. There is coronary vascular calcification. No intra-abdominal free air or free fluid. Hepatobiliary: The liver is unremarkable. No intrahepatic biliary ductal dilatation. There is probable small amount of sludge within the gallbladder. No calcified stone. No pericholecystic fluid. Pancreas: Unremarkable. No pancreatic ductal dilatation or surrounding inflammatory changes. Spleen: Normal in size without focal abnormality. Adrenals/Urinary Tract: The adrenal glands are unremarkable. There is mild right hydronephroureter. No obstructing stone. The left kidney is unremarkable the urinary bladder is partially distended and appears grossly unremarkable for the degree of distention Stomach/Bowel: There is diffuse thickened and inflamed appearance of the colon consistent with pancolitis. Correlation with clinical exam and stool cultures recommended there is no bowel obstruction. The appendix is normal. Vascular/Lymphatic: Advanced aortoiliac atherosclerotic disease. No portal venous gas. There is no adenopathy. Reproductive: The prostate gland is enlarged and somewhat asymmetric and nodular. It measures approximately 6 cm in transverse diameter. Clinical correlation is recommended. Other: There is diffuse stranding of the mesentery and subcutaneous edema. Small amount of fluid noted in the left inguinal canal. Musculoskeletal: Degenerative changes of the spine. No acute osseous pathology. IMPRESSION: 1. Pancolitis. Correlation with clinical exam and stool cultures recommended. No bowel obstruction. Normal appendix. 2. Partially  visualized small pleural effusions. Diffuse mesenteric edema, and anasarca 3. Anemia. 4. Enlarged prostate gland. 5. Mild right hydronephroureter relatively similar to prior CT. 6. Advanced Aortic Atherosclerosis (ICD10-I70.0). Electronically Signed   By: Anner Crete M.D.   On: 04/28/2017 23:00   Ct Abdomen Pelvis  Wo Contrast  Result Date: 04/09/2017 CLINICAL DATA:  Diverticulitis EXAM: CT ABDOMEN AND PELVIS WITHOUT CONTRAST TECHNIQUE: Multidetector CT imaging of the abdomen and pelvis was performed following the standard protocol without IV contrast. COMPARISON:  PET-CT 10/14/2016 FINDINGS: Lower chest: Atheromatous aortic calcifications. No pleural or pericardial effusion. Subpleural scarring or atelectasis posteriorly in the visualized lower lobes. Hepatobiliary: No focal liver abnormality is seen. No gallstones, gallbladder wall thickening, or biliary dilatation. Pancreas: Diffuse atrophy without ductal dilatation or mass. Spleen: Normal in size without focal abnormality. Adrenals/Urinary Tract: Normal adrenals. Mild hydronephrosis, increased on the right since previous exam. New right ureterectasis. Urinary bladder is physiologically distended. Stomach/Bowel: Stomach is nondistended. Small bowel is nondilated. There is a loop of distal ileum in left inguinal hernia without suggestion of obstruction or strangulation. Normal appendix. The colon is nondilated with suggestion of wall thickening in the proximal transverse segment and more conspicuously circumferentially in the distal descending and sigmoid segments, with adjacent mild inflammatory/edematous changes. No significant diverticular disease. Vascular/Lymphatic: Extensive aortic atheromatous plaque involving visceral and iliac arteries as well, without aneurysm. Ectatic infrarenal segment with stable partially calcified eccentric mural thrombus. Reproductive: Marked prostatic enlargement. Subcentimeter left para-aortic lymph nodes. No pelvic adenopathy. Other: No ascites.  No free air. Musculoskeletal: Left inguinal hernia containing a loop of small bowel without evidence of obstruction or strangulation. Spondylitic changes in the lower lumbar spine. No fracture or worrisome bone lesion. IMPRESSION: 1. Nonspecific colitis involving transverse, descending and  sigmoid segments. 2. Progressive hydronephrosis right greater than left, and right ureterectasis since previous PET-CT of 10/14/2016. 3. Left inguinal hernia involving a loop of ileum, without obstruction or strangulation. 4. Aortic Atherosclerosis (ICD10-170.0). Ectatic abdominal aorta at risk for aneurysm development. Recommend followup by ultrasound in 5 years. This recommendation follows ACR consensus guidelines: White Paper of the ACR Incidental Findings Committee II on Vascular Findings. J Am Coll Radiol 2013; 10:789-794. Electronically Signed   By: Lucrezia Europe M.D.   On: 04/09/2017 11:35     Subjective: - no chest pain, shortness of breath, no abdominal pain, nausea or vomiting.   Discharge Exam: Vitals:   05/02/17 0555 05/02/17 0900  BP: (!) 169/66 (!) 162/79  Pulse: (!) 58 62  Resp: 19 18  Temp: 98.4 F (36.9 C) 98.6 F (37 C)  SpO2: 98% 97%    General: Pt is alert, awake, not in acute distress Cardiovascular: RRR, S1/S2 +, no rubs, no gallops Respiratory: CTA bilaterally, no wheezing, no rhonchi Abdominal: Soft, NT, ND, bowel sounds + Extremities: no edema, no cyanosis    The results of significant diagnostics from this hospitalization (including imaging, microbiology, ancillary and laboratory) are listed below for reference.     Microbiology: Recent Results (from the past 240 hour(s))  Culture, blood (x 2)     Status: None (Preliminary result)   Collection Time: 04/28/17  9:13 PM  Result Value Ref Range Status   Specimen Description BLOOD RIGHT ANTECUBITAL  Final   Special Requests   Final    BOTTLES DRAWN AEROBIC AND ANAEROBIC Blood Culture adequate volume   Culture   Final    NO GROWTH 2 DAYS Performed at [facility], [address] [place], [place]  [address]    Report Status PENDING  Incomplete  Culture, blood (x 2)     Status: None (Preliminary result)   Collection Time: 04/28/17 10:50 PM  Result Value Ref Range Status   Specimen Description BLOOD  RIGHT WRIST  Final   Special Requests   Final    BOTTLES DRAWN AEROBIC AND ANAEROBIC Blood Culture adequate volume   Culture   Final    NO GROWTH 2 DAYS Performed at [facility], [address] [place], [place] [address]    Report Status PENDING  Incomplete  C difficile quick scan w PCR reflex     Status: Abnormal   Collection Time: 04/29/17  1:16 AM  Result Value Ref Range Status   C Diff antigen POSITIVE (A) NEGATIVE Final   C Diff toxin POSITIVE (A) NEGATIVE Final   C Diff interpretation Toxin producing C. difficile detected.  Final    Comment: CRITICAL RESULT CALLED TO, READ BACK BY AND VERIFIED WITH: Marcheta Grammes RN 04/29/17 0551 JDW Performed at [facility], [address], [place], [place] [address]      Labs: BNP (last 3 results) No results for input(s): BNP in the last 8760 hours. Basic Metabolic Panel: Recent Labs  Lab 04/28/17 1537 04/28/17 1606 04/29/17 0033 04/30/17 0516 05/01/17 0647 05/02/17 0547  NA 142 142 138 139 142 142  K 5.0 4.9 3.9 2.7* 3.3* 4.0  CL 113* 112* 111 112* 114* 113*  CO2 17*  --  19* 18* 18* 20*  GLUCOSE 101* 98 117* 94 94 104*  BUN 42* 35* 39* 39* 39* 39*  CREATININE 4.11* 4.10* 4.05* 3.83* 3.99* 3.99*  CALCIUM 8.2*  --  7.3* 7.0* 7.1* 7.5*   Liver Function Tests: No results for input(s): AST, ALT, ALKPHOS, BILITOT, PROT, ALBUMIN in the last 168 hours. No results for input(s): LIPASE, AMYLASE in the last 168 hours. No results for input(s): AMMONIA in the last 168 hours. CBC: Recent Labs  Lab 04/28/17 1606 04/28/17 2109 04/29/17 0033 04/30/17 0516 05/01/17 0510  WBC  --  10.7* 10.4 6.3 6.5  NEUTROABS  --  8.7*  --   --   --   HGB 11.9* 10.1* 9.9* 9.3* 9.9*  HCT 35.0* 31.1* 31.5* 29.8* 31.1*  MCV  --  93.7 92.4 92.3 94.2  PLT  --  174 174 169 170   Cardiac Enzymes: No results for input(s): CKTOTAL, CKMB, CKMBINDEX, TROPONINI in the last 168 hours. BNP: Invalid input(s): POCBNP CBG: Recent Labs  Lab  04/30/17 0740 05/01/17 0743 05/02/17 0728  GLUCAP 88 91 81   D-Dimer No results for input(s): DDIMER in the last 72 hours. Hgb A1c No results for input(s): HGBA1C in the last 72 hours. Lipid Profile No results for input(s): CHOL, HDL, LDLCALC, TRIG, CHOLHDL, LDLDIRECT in the last 72 hours. Thyroid function studies No results for input(s): TSH, T4TOTAL, T3FREE, THYROIDAB in the last 72 hours.  Invalid input(s): FREET3 Anemia work up No results for input(s): VITAMINB12, FOLATE, FERRITIN, TIBC, IRON, RETICCTPCT in the last 72 hours. Urinalysis    Component Value Date/Time   COLORURINE YELLOW 04/09/2017 1947   APPEARANCEUR CLEAR 04/09/2017 1947   LABSPEC 1.013 04/09/2017 1947   PHURINE 6.0 04/09/2017 1947   GLUCOSEU 150 (A) 04/09/2017 1947   HGBUR MODERATE (A) 04/09/2017 [place] NEGATIVE 04/09/2017 1947   KETONESUR 5 (A) 04/09/2017 1947   PROTEINUR >=300 (A) 04/09/2017 1947   NITRITE NEGATIVE 04/09/2017 1947   LEUKOCYTESUR  NEGATIVE 04/09/2017 1947   Sepsis Labs Invalid input(s): PROCALCITONIN,  WBC,  LACTICIDVEN   Time coordinating discharge: 40 minutes  SIGNED:  Marzetta Board, MD  [facility] 05/02/2017, 3:06 PM Pager [phone]  If 7PM-7AM, please contact night-coverage [email or web address] Password [email or web address]

## 2017-05-02 NOTE — Care Management Note (Signed)
Case Management Note  Patient Details  Name: Marcus Lane MRN: [id number] Date of Birth: November 22, 1931  Subjective/Objective:                    Action/Plan:   Expected Discharge Date:                  Expected Discharge Plan:  [place]  In-House Referral:  Clinical Social Work  Discharge planning Services  CM Consult  Post Acute Care Choice:    [facility] Status of Service:  Completed, signed off  Additional Comments: Pt medically stable for dc and bed available today at SNF. Plan dc to [facility] today, per CSW arrangements.   Kristen Cardinal, RN  Nurse case manager [place] 05/02/2017, 2:03 PM

## 2017-05-02 NOTE — Evaluation (Signed)
Occupational Therapy Evaluation Patient Details Name: Marcus Lane MRN: [id number] DOB: January 02, 1932 Today's Date: 05/02/2017    History of Present Illness 84yo male presenting with diarrhea and generalized weakness, recently hospitalized 04/09/17 to 04/12/17 due to diarrhea, C-diff and discharged to rehab facility. He was discharged from the rehab facility and went home, where his diarrhea worsened. Diagnosed with diarrhea and sepsis likely due to C-diff. PMH prostate CA, CKD stage III, C-diff, HTN, polio, hx ORIF R UE    Clinical Impression   This 84 y/o M presents with the above. Pt currently requiring MinA for UB ADLs, ModA for LB ADLs and minA for sit<>stand transfers, presenting with generalized weakness and decreased dynamic balance. Pt anticipating d/c to SNF later today, feel this recommendation is appropriate to maximize his overall safety and independence with ADLs and mobility prior to return home. Will continue to follow while in acute setting.     Follow Up Recommendations  SNF;Supervision/Assistance - 24 hour    Equipment Recommendations  Other (comment)(defer to next venue )           Precautions / Restrictions Precautions Precautions: Fall;Other (comment) Precaution Comments: enteric precautions  Restrictions Weight Bearing Restrictions: No      Mobility Bed Mobility               General bed mobility comments: sitting EOB upon entering room   Transfers Overall transfer level: Needs assistance Equipment used: Rolling walker (2 wheeled) Transfers: Sit to/from Stand Sit to Stand: Min assist         General transfer comment: MinA for power up and steadying initially    Balance Overall balance assessment: Needs assistance Sitting-balance support: Bilateral upper extremity supported;Feet supported;No upper extremity supported Sitting balance-Leahy Scale: Good Sitting balance - Comments: static sitting EOB    Standing balance support: Bilateral upper  extremity supported;During functional activity Standing balance-Leahy Scale: Fair Standing balance comment: Minguard-minA during static standing while Pt completes clothing management standing at EOB                            ADL either performed or assessed with clinical judgement   ADL Overall ADL's : Needs assistance/impaired Eating/Feeding: Set up;Sitting Eating/Feeding Details (indicate cue type and reason): setup to open containers Grooming: Set up;Sitting   Upper Body Bathing: Sitting;Min guard   Lower Body Bathing: Minimal assistance;Sit to/from stand   Upper Body Dressing : Min guard;Sitting Upper Body Dressing Details (indicate cue type and reason): donning button up shirt Lower Body Dressing: Sit to/from stand;Moderate assistance Lower Body Dressing Details (indicate cue type and reason): pt donning underwear, pants, socks and shoes; requires intermittent assist to thread LEs into pant legs and steadying assist in standing while pt advances over hips; assist to don R sock and to fasten shoes              Functional mobility during ADLs: Minimal assistance General ADL Comments: pt completing UB/LB dressing seated EOB with MinA for sit<>stand                          Pertinent Vitals/Pain Pain Assessment: No/denies pain          Extremity/Trunk Assessment Upper Extremity Assessment Upper Extremity Assessment: Generalized weakness   Lower Extremity Assessment Lower Extremity Assessment: Defer to PT evaluation   Cervical / Trunk Assessment Cervical / Trunk Assessment: Normal   Communication Communication Communication: No difficulties;Prefers language  other than English(Spanish speaking though communicates well in [place])   Cognition Arousal/Alertness: Awake/alert Behavior During Therapy: WFL for tasks assessed/performed Overall Cognitive Status: Within Functional Limits for tasks assessed                                                       Home Living Family/patient expects to be discharged to:: Skilled nursing facility                                        Prior Functioning/Environment Level of Independence: Independent with assistive device(s)                 OT Problem List: Decreased strength;Decreased range of motion;Impaired balance (sitting and/or standing);Decreased knowledge of use of DME or AE;Decreased activity tolerance      OT Treatment/Interventions: Self-care/ADL training;DME and/or AE instruction;Therapeutic activities;Balance training;Therapeutic exercise;Energy conservation;Patient/family education    OT Goals(Current goals can be found in the care plan section) Acute Rehab OT Goals Patient Stated Goal: to go to SNF today  OT Goal Formulation: With patient Time For Goal Achievement: 05/16/17 Potential to Achieve Goals: Good  OT Frequency: Min 2X/week                             AM-PAC PT "6 Clicks" Daily Activity     Outcome Measure Help from another person eating meals?: A Little Help from another person taking care of personal grooming?: A Little Help from another person toileting, which includes using toliet, bedpan, or urinal?: A Lot Help from another person bathing (including washing, rinsing, drying)?: A Lot Help from another person to put on and taking off regular upper body clothing?: A Little Help from another person to put on and taking off regular lower body clothing?: A Lot 6 Click Score: 15   End of Session Nurse Communication: Mobility status;Other (comment)(pt sitting EOB )  Activity Tolerance: Patient tolerated treatment well Patient left: with bed alarm set;with call bell/phone within reach;Other (comment)(sitting EOB )  OT Visit Diagnosis: Muscle weakness (generalized) (M62.81)                Time: 2297-9892 OT Time Calculation (min): 23 min Charges:  OT General Charges $OT Visit: 1 Visit OT Evaluation $OT Eval  Moderate Complexity: 1 Mod G-Codes:     Marcus Lane, OT Pager [phone] 05/02/2017  Marcus Lane 05/02/2017, 1:17 PM

## 2017-05-02 NOTE — Progress Notes (Addendum)
12:11 pm CSW met with patient at bedside. Patient called his son Fabian and put him on speaker phone. CSW and patient discussed SNF placement. After lengthy discussion, patient chose [facility]. CSW called [facility] and confirmed bed still available. Facility to start insurance authorization; awaiting determination. Facility aware patient ready to discharge.  10:48 am CSW called patient's son, Caswell, and requested SNF choice; weekend CSW provided list of offers yesterday. Kenry will talk to his brother and call CSW back. CSW made son aware that choice is needed soon, as patient is ready for discharge and chosen facility will need to obtain insurance authorization. CSW to support with discharge.  Susan Porter, LCSWA [phone]  

## 2017-05-02 NOTE — Progress Notes (Signed)
[facility] has obtained patient's [facility] authorization.  Patient will discharge to [facility]. Anticipated discharge date: 05/02/17 Family notified: Jeryn Cerney, son Transportation by: [facility]  Nurse to call report to [phone]. Patient will go to room 3214 at the facility.   CSW signing off.  Estanislado Emms, [place]  Clinical Social Worker

## 2017-05-02 NOTE — Clinical Social Work Placement (Signed)
   CLINICAL SOCIAL WORK PLACEMENT  NOTE  Date:  05/02/2017  Patient Details  Name: Marcus Lane MRN: [id number] Date of Birth: [birth year]  Clinical Social Work is seeking post-discharge placement for this patient at the [place] level of care (*CSW will initial, date and re-position this form in  chart as items are completed):  Yes   Patient/family provided with [facility] Work Department's list of facilities offering this level of care within the geographic area requested by the patient (or if unable, by the patient's family).  Yes   Patient/family informed of their freedom to choose among providers that offer the needed level of care, that participate in Medicare, Medicaid or managed care program needed by the patient, have an available bed and are willing to accept the patient.  Yes   Patient/family informed of [facility]'s ownership interest in [facility] and [facility], as well as of the fact that they are under no obligation to receive care at these facilities.  PASRR submitted to EDS on       PASRR number received on       Existing PASRR number confirmed on 04/30/17     FL2 transmitted to all facilities in geographic area requested by pt/family on 05/01/17     FL2 transmitted to all facilities within larger geographic area on       Patient informed that his/her managed care company has contracts with or will negotiate with certain facilities, including the following:  [facility]     Yes   Patient/family informed of bed offers received.  Patient chooses bed at [facility]     Physician recommends and patient chooses bed at [facility]    Patient to be transferred to [facility] on 05/02/17.  Patient to be transferred to facility by [facility]     Patient family notified on 05/02/17 of transfer.  Name of family member notified:  Ollivander See, son      PHYSICIAN Please prepare priority discharge summary, including medications, Please prepare prescriptions     Additional Comment:    _______________________________________________ Estanislado Emms, LCSW 05/02/2017, 12:17 PM

## 2017-05-02 NOTE — Progress Notes (Signed)
CSW completed [facility] admission paperwork with patient at bedside and faxed paperwork to the facility. CSW awaiting facility to obtain [facility] authorization for patient before patient can admit.  Marcus Lane, [place]

## 2017-05-04 LAB — CULTURE, BLOOD (ROUTINE X 2)
CULTURE: NO GROWTH
Culture: NO GROWTH
SPECIAL REQUESTS: ADEQUATE
Special Requests: ADEQUATE

## 2017-05-13 ENCOUNTER — Inpatient Hospital Stay (HOSPITAL_COMMUNITY)
Admission: EM | Admit: 2017-05-13 | Discharge: 2017-05-17 | DRG: 291 | Disposition: A | Discharge status: Hospice | Payer: Medicare Other | Attending: Internal Medicine | Admitting: Internal Medicine

## 2017-05-13 ENCOUNTER — Encounter (HOSPITAL_COMMUNITY): Payer: Self-pay | Admitting: Emergency Medicine

## 2017-05-13 ENCOUNTER — Other Ambulatory Visit: Payer: Self-pay

## 2017-05-13 ENCOUNTER — Emergency Department (HOSPITAL_COMMUNITY): Payer: Medicare Other

## 2017-05-13 DIAGNOSIS — I132 Hypertensive heart and chronic kidney disease with heart failure and with stage 5 chronic kidney disease, or end stage renal disease: Principal | ICD-10-CM | POA: Diagnosis present

## 2017-05-13 DIAGNOSIS — R600 Localized edema: Secondary | ICD-10-CM | POA: Diagnosis present

## 2017-05-13 DIAGNOSIS — R7611 Nonspecific reaction to tuberculin skin test without active tuberculosis: Secondary | ICD-10-CM | POA: Diagnosis not present

## 2017-05-13 DIAGNOSIS — Z833 Family history of diabetes mellitus: Secondary | ICD-10-CM

## 2017-05-13 DIAGNOSIS — I501 Left ventricular failure: Secondary | ICD-10-CM | POA: Diagnosis present

## 2017-05-13 DIAGNOSIS — Z8546 Personal history of malignant neoplasm of prostate: Secondary | ICD-10-CM

## 2017-05-13 DIAGNOSIS — R339 Retention of urine, unspecified: Secondary | ICD-10-CM | POA: Diagnosis present

## 2017-05-13 DIAGNOSIS — Z7189 Other specified counseling: Secondary | ICD-10-CM

## 2017-05-13 DIAGNOSIS — Z7982 Long term (current) use of aspirin: Secondary | ICD-10-CM

## 2017-05-13 DIAGNOSIS — I509 Heart failure, unspecified: Secondary | ICD-10-CM

## 2017-05-13 DIAGNOSIS — R0600 Dyspnea, unspecified: Secondary | ICD-10-CM | POA: Insufficient documentation

## 2017-05-13 DIAGNOSIS — R338 Other retention of urine: Secondary | ICD-10-CM | POA: Diagnosis present

## 2017-05-13 DIAGNOSIS — Z515 Encounter for palliative care: Secondary | ICD-10-CM | POA: Diagnosis not present

## 2017-05-13 DIAGNOSIS — Z8612 Personal history of poliomyelitis: Secondary | ICD-10-CM

## 2017-05-13 DIAGNOSIS — I5021 Acute systolic (congestive) heart failure: Secondary | ICD-10-CM

## 2017-05-13 DIAGNOSIS — I10 Essential (primary) hypertension: Secondary | ICD-10-CM | POA: Diagnosis present

## 2017-05-13 DIAGNOSIS — D631 Anemia in chronic kidney disease: Secondary | ICD-10-CM | POA: Diagnosis present

## 2017-05-13 DIAGNOSIS — Z66 Do not resuscitate: Secondary | ICD-10-CM | POA: Diagnosis present

## 2017-05-13 DIAGNOSIS — Z227 Latent tuberculosis: Secondary | ICD-10-CM

## 2017-05-13 DIAGNOSIS — Z87891 Personal history of nicotine dependence: Secondary | ICD-10-CM

## 2017-05-13 DIAGNOSIS — D638 Anemia in other chronic diseases classified elsewhere: Secondary | ICD-10-CM | POA: Diagnosis present

## 2017-05-13 DIAGNOSIS — N186 End stage renal disease: Secondary | ICD-10-CM | POA: Diagnosis present

## 2017-05-13 LAB — CBC WITH DIFFERENTIAL/PLATELET
Basophils Absolute: 0.1 10*3/uL (ref 0.0–0.1)
Basophils Relative: 1 %
Eosinophils Absolute: 0.2 10*3/uL (ref 0.0–0.7)
Eosinophils Relative: 1 %
HCT: 35.7 % — ABNORMAL LOW (ref 39.0–52.0)
Hemoglobin: 11.3 g/dL — ABNORMAL LOW (ref 13.0–17.0)
Lymphocytes Relative: 17 %
Lymphs Abs: 2 10*3/uL (ref 0.7–4.0)
MCH: 29.7 pg (ref 26.0–34.0)
MCHC: 31.7 g/dL (ref 30.0–36.0)
MCV: 93.7 fL (ref 78.0–100.0)
Monocytes Absolute: 0.6 10*3/uL (ref 0.1–1.0)
Monocytes Relative: 5 %
Neutro Abs: 8.8 10*3/uL — ABNORMAL HIGH (ref 1.7–7.7)
Neutrophils Relative %: 76 %
Platelets: 176 10*3/uL (ref 150–400)
RBC: 3.81 MIL/uL — ABNORMAL LOW (ref 4.22–5.81)
RDW: 17.8 % — ABNORMAL HIGH (ref 11.5–15.5)
WBC: 11.6 10*3/uL — ABNORMAL HIGH (ref 4.0–10.5)

## 2017-05-13 LAB — URINALYSIS, ROUTINE W REFLEX MICROSCOPIC
Bilirubin Urine: NEGATIVE
Glucose, UA: 150 mg/dL — AB
Hgb urine dipstick: NEGATIVE
Ketones, ur: NEGATIVE mg/dL
Leukocytes, UA: NEGATIVE
Nitrite: POSITIVE — AB
SQUAMOUS EPITHELIAL / LPF: NONE SEEN
Specific Gravity, Urine: 1.015 (ref 1.005–1.030)
pH: 7 (ref 5.0–8.0)

## 2017-05-13 LAB — BASIC METABOLIC PANEL
Anion gap: 11 (ref 5–15)
BUN: 48 mg/dL — ABNORMAL HIGH (ref 6–20)
CO2: 23 mmol/L (ref 22–32)
Calcium: 8.2 mg/dL — ABNORMAL LOW (ref 8.9–10.3)
Chloride: 109 mmol/L (ref 101–111)
Creatinine, Ser: 4.31 mg/dL — ABNORMAL HIGH (ref 0.61–1.24)
GFR calc Af Amer: 13 mL/min — ABNORMAL LOW (ref >60)
GFR calc non Af Amer: 11 mL/min — ABNORMAL LOW (ref >60)
Glucose, Bld: 105 mg/dL — ABNORMAL HIGH (ref 65–99)
Potassium: 4.7 mmol/L (ref 3.5–5.1)
Sodium: 143 mmol/L (ref 135–145)

## 2017-05-13 LAB — I-STAT TROPONIN, ED: Troponin i, poc: 0.05 ng/mL (ref 0.00–0.08)

## 2017-05-13 LAB — BRAIN NATRIURETIC PEPTIDE: B Natriuretic Peptide: 4424.7 pg/mL — ABNORMAL HIGH (ref 0.0–100.0)

## 2017-05-13 MED ORDER — FERROUS SULFATE 325 (65 FE) MG PO TABS
325.0000 mg | ORAL_TABLET | Freq: Two times a day (BID) | ORAL | Status: DC
  Administered 2017-05-13 – 2017-05-14 (×2): 325 mg via ORAL
  Filled 2017-05-13 (×3): qty 1

## 2017-05-13 MED ORDER — ACETAMINOPHEN 650 MG RE SUPP: 650.0000 mg | Freq: Four times a day (QID) | RECTAL | Status: DC | PRN

## 2017-05-13 MED ORDER — SODIUM BICARBONATE 650 MG PO TABS
1300.0000 mg | ORAL_TABLET | Freq: Two times a day (BID) | ORAL | Status: DC
  Administered 2017-05-13: 1300 mg via ORAL
  Filled 2017-05-13 (×4): qty 2

## 2017-05-13 MED ORDER — METOPROLOL TARTRATE 25 MG PO TABS
25.0000 mg | ORAL_TABLET | Freq: Two times a day (BID) | ORAL | Status: DC
Start: 2017-05-13 — End: 2017-05-17
  Administered 2017-05-13: 25 mg via ORAL
  Filled 2017-05-13 (×4): qty 1

## 2017-05-13 MED ORDER — ACETAMINOPHEN 325 MG PO TABS: 650.0000 mg | ORAL_TABLET | Freq: Four times a day (QID) | ORAL | Status: DC | PRN

## 2017-05-13 MED ORDER — ASPIRIN EC 81 MG PO TBEC
81.0000 mg | DELAYED_RELEASE_TABLET | Freq: Every day | ORAL | Status: DC
  Filled 2017-05-13 (×2): qty 1

## 2017-05-13 MED ORDER — ONDANSETRON HCL 4 MG/2ML IJ SOLN: 4.0000 mg | Freq: Four times a day (QID) | INTRAMUSCULAR | Status: DC | PRN

## 2017-05-13 MED ORDER — CALCIUM ACETATE (PHOS BINDER) 667 MG PO CAPS
667.0000 mg | ORAL_CAPSULE | Freq: Three times a day (TID) | ORAL | Status: DC
  Administered 2017-05-14: 667 mg via ORAL
  Filled 2017-05-13 (×2): qty 1

## 2017-05-13 MED ORDER — ONDANSETRON HCL 4 MG PO TABS: 4.0000 mg | ORAL_TABLET | Freq: Four times a day (QID) | ORAL | Status: DC | PRN

## 2017-05-13 MED ORDER — POTASSIUM CHLORIDE 20 MEQ/15ML (10%) PO SOLN
20.0000 meq | Freq: Two times a day (BID) | ORAL | Status: DC
Start: 2017-05-13 — End: 2017-05-16
  Administered 2017-05-13: 20 meq via ORAL
  Filled 2017-05-13 (×3): qty 15

## 2017-05-13 MED ORDER — FUROSEMIDE 10 MG/ML IJ SOLN
120.0000 mg | Freq: Once | INTRAVENOUS | Status: AC
  Administered 2017-05-13: 120 mg via INTRAVENOUS
  Filled 2017-05-13: qty 2

## 2017-05-13 MED ORDER — FUROSEMIDE 20 MG PO TABS: 160.0000 mg | ORAL_TABLET | Freq: Three times a day (TID) | ORAL | Status: DC

## 2017-05-13 MED ORDER — FINASTERIDE 5 MG PO TABS
5.0000 mg | ORAL_TABLET | Freq: Every day | ORAL | Status: DC
  Administered 2017-05-13: 5 mg via ORAL
  Filled 2017-05-13 (×2): qty 1

## 2017-05-13 MED ORDER — PRAVASTATIN SODIUM 40 MG PO TABS
20.0000 mg | ORAL_TABLET | Freq: Every day | ORAL | Status: DC
  Administered 2017-05-13: 20 mg via ORAL
  Filled 2017-05-13 (×2): qty 1

## 2017-05-13 MED ORDER — TAMSULOSIN HCL 0.4 MG PO CAPS
0.4000 mg | ORAL_CAPSULE | Freq: Every day | ORAL | Status: DC
Start: 2017-05-13 — End: 2017-05-17
  Administered 2017-05-13: 0.4 mg via ORAL
  Filled 2017-05-13 (×2): qty 1

## 2017-05-13 NOTE — ED Provider Notes (Signed)
[facility] EMERGENCY DEPARTMENT Provider Note   CSN: [id number] Arrival date & time: 05/13/17  1010     History   Chief Complaint Chief Complaint  Patient presents with  . Possible TB    HPI Quentavious Rittenhouse is a 84 y.o. male with history of prostate cancer, CKD, HTN, and history of polio presents for evaluation of gradual onset, progressive Lee worsening bilateral lower extremity edema and shortness of breath for 1 week.  He notes that he used to take 3 tablets of furosemide twice daily but recently established residents at [facility] assisted living facility and is unsure if he has been receiving this medication.  He notes orthopnea, DOE, and PND.  Denies chest pain, fevers, or chills.  He was also sent over from his assisted living facility because he had a positive TB skin test.  He is from [place] originally and immigrated to the [place] in [place].  He denies night sweats, weight loss.  He has a chronic cough but states it is unchanged.  No hemoptysis.  He recently quit smoking last month but has a history of smoking a pack and a half daily and has been smoking for 60 years. The history is provided by the patient.    Past Medical History:  Diagnosis Date  . Cancer [facility]) 2017   prostate  . CKD (chronic kidney disease), stage III ([place])   . Clostridium difficile colitis 04/2017  . Hypertension   . Polio    As a child    Patient Active Problem List   Diagnosis Date Noted  . Dyspnea 05/13/2017  . Bilateral leg edema 05/13/2017  . Malnutrition of moderate degree 04/30/2017  . DNR (do not resuscitate) discussion   . Adjustment disorder with depressed mood   . Diarrhea 04/28/2017  . Sepsis ([place]) 04/28/2017  . Tobacco abuse 04/28/2017  . End stage renal disease ([place]) 04/14/2017  . History of poliomyelitis 04/14/2017  . Anemia of chronic disease 04/14/2017  . Essential hypertension 04/14/2017  . History of prostate cancer 04/14/2017  . Dyslipidemia  04/14/2017  . Colitis 04/09/2017  . Clostridium difficile enterocolitis 04/08/2017    Past Surgical History:  Procedure Laterality Date  . CRYOABLATION N/A 06/09/2015   Procedure: CRYO ABLATION PROSTATE;  Surgeon: Carolan Clines, MD;  Location: [facility];  Service: Urology;  Laterality: N/A;  . orif arm Right 1963   accident w/surgical repair-rt arm       Home Medications    Prior to Admission medications   Medication Sig Start Date End Date Taking? Authorizing Provider  aspirin EC 81 MG tablet Take 1 tablet (81 mg total) by mouth daily. 05/02/17 05/02/18  Caren Griffins, MD  calcium acetate (PHOSLO) 667 MG capsule Take 667 mg by mouth 3 (three) times daily.  03/15/17   [provider]  ferrous sulfate 325 (65 FE) MG tablet Take 325 mg by mouth 2 (two) times daily with a meal.    [provider]  fidaxomicin (DIFICID) 200 MG TABS tablet Take 1 tablet (200 mg total) by mouth 2 (two) times daily. For 6 days, end date 05/08/2017 05/02/17   Caren Griffins, MD  finasteride (PROSCAR) 5 MG tablet Take 5 mg by mouth daily. 03/15/17   [provider]  furosemide (LASIX) 80 MG tablet Take 80 mg by mouth 3 (three) times daily. 03/15/17   [provider]  lovastatin (MEVACOR) 20 MG tablet Take 20 mg by mouth at bedtime.  [provider]  metoprolol tartrate (LOPRESSOR) 25 MG tablet Take 1 tablet (25 mg total) by mouth 2 (two) times daily. 05/02/17   Caren Griffins, MD  potassium chloride 20 MEQ/15ML (10%) SOLN Take 20 mEq by mouth 2 (two) times daily. 04/01/17   [provider]  sodium bicarbonate 650 MG tablet Take 1,300 mg by mouth 2 (two) times daily.  03/18/17   [provider]  tamsulosin (FLOMAX) 0.4 MG CAPS capsule Take 0.4 mg by mouth daily. 03/15/17   [provider]  triamcinolone cream (KENALOG) 0.5 % Apply 1 application topically 2 (two) times daily.  03/25/17   [provider]  VITAMIN E PO  Take 1 capsule by mouth daily.    [provider]    Family History Family History  Problem Relation Age of Onset  . Diabetes Mother     Social History Social History   Tobacco Use  . Smoking status: Light Tobacco Smoker    Packs/day: 0.25  . Smokeless tobacco: Never Used  Substance Use Topics  . Alcohol use: Yes    Alcohol/week: 4.8 oz    Types: 8 Cans of beer per week  . Drug use: No     Allergies   Patient has no known allergies.   Review of Systems Review of Systems  Constitutional: Negative for chills, fatigue, fever and unexpected weight change.  Respiratory: Positive for cough and shortness of breath.   Cardiovascular: Positive for leg swelling. Negative for chest pain and palpitations.  Gastrointestinal: Negative for abdominal pain, nausea and vomiting.  All other systems reviewed and are negative.    Physical Exam Updated Vital Signs BP (!) 162/87 (BP Location: Right Arm)   Pulse (!) 55   Temp 98.2 F (36.8 C) (Oral)   Resp (!) 25   Ht 5\' 6"  (1.676 m)   Wt 75.3 kg (166 lb)   SpO2 96%   BMI 26.79 kg/m   Physical Exam  Constitutional: He appears well-developed and well-nourished. No distress.  HENT:  Head: Normocephalic and atraumatic.  Eyes: Conjunctivae are normal. Right eye exhibits no discharge. Left eye exhibits no discharge.  Neck: Normal range of motion. Neck supple. No JVD present. No tracheal deviation present.  Cardiovascular: Normal rate, regular rhythm and intact distal pulses.  3+ pitting edema of the bilateral lower extremities.  Chronic venous stasis skin changes noted.  2+ radial and DP/PT pulses bl, negative Homan's bl   Pulmonary/Chest: He exhibits no tenderness.  Mild difficulty speaking in full sentences.  Diminished breath sounds globally, worst in the bases.  Equal rise and fall of chest.  Abdominal: Soft. Bowel sounds are normal. He exhibits no distension. There is no tenderness.  Musculoskeletal: He exhibits no  edema.  Neurological: He is alert.  Fluent speech, no facial droop  Skin: Skin is warm and dry. No erythema.  Psychiatric: He has a normal mood and affect. His behavior is normal.  Nursing note and vitals reviewed.    ED Treatments / Results  Labs (all labs ordered are listed, but only abnormal results are displayed) Labs Reviewed  CBC WITH DIFFERENTIAL/PLATELET - Abnormal; Notable for the following components:      Result Value   WBC 11.6 (*)    RBC 3.81 (*)    Hemoglobin 11.3 (*)    HCT 35.7 (*)    RDW 17.8 (*)    Neutro Abs 8.8 (*)    All other components within normal limits  BASIC METABOLIC PANEL -  Abnormal; Notable for the following components:   Glucose, Bld 105 (*)    BUN 48 (*)    Creatinine, Ser 4.31 (*)    Calcium 8.2 (*)    GFR calc non Af Amer 11 (*)    GFR calc Af Amer 13 (*)    All other components within normal limits  BRAIN NATRIURETIC PEPTIDE - Abnormal; Notable for the following components:   B Natriuretic Peptide 4,424.7 (*)    All other components within normal limits  I-STAT TROPONIN, ED    EKG  EKG Interpretation  Date/Time:  Friday May 13 2017 12:11:03 EST Ventricular Rate:  61 PR Interval:    QRS Duration: 83 QT Interval:  469 QTC Calculation: 473 R Axis:   58 Text Interpretation:  Sinus rhythm Probable left atrial enlargement Abnormal T, consider ischemia, lateral leads Confirmed by Nat Christen [phone]) on 05/13/2017 2:34:08 PM       Radiology Dg Chest 2 View  Result Date: 05/13/2017 CLINICAL DATA:  TB screening, productive cough for 1 week, hypertension, light smoker EXAM: CHEST - 2 VIEW COMPARISON:  02/24/2005 FINDINGS: Enlargement of cardiac silhouette with pulmonary vascular congestion. Atherosclerotic calcification aorta. Diffuse BILATERAL pulmonary infiltrates likely representing pulmonary edema. Bibasilar pleural effusions and mild atelectasis. No pneumothorax. Bones demineralized with dextroconvex thoracic scoliosis. IMPRESSION:  Probable CHF. Electronically Signed   By: Lavonia Dana M.D.   On: 05/13/2017 11:16    Procedures Procedures (including critical care time)  Medications Ordered in ED Medications  furosemide (LASIX) 120 mg in dextrose 5 % 50 mL IVPB (120 mg Intravenous New Bag/Given 05/13/17 1717)     Initial Impression / Assessment and Plan / ED Course  I have reviewed the triage vital signs and the nursing notes.  Pertinent labs & imaging results that were available during my care of the patient were reviewed by me and considered in my medical decision making (see chart for details).     Patient presents today brought in by assisted living facility with concern for positive TB skin test.  He is afebrile, bradycardic and tachypneic while in the ED as well as hypertensive.  He is not hypoxic, however at one point on my examination his respiratory rate was 37.  He is taking shallow breaths and speaking in short phrases.  History and physical examination consistent with CHF exacerbation.  He has 3+ pitting edema, BNP greater than 4400, and chest x-ray with bilateral pulmonary infiltrates and pleural effusions.  He has no symptoms of active TB infection.  Troponin is negative.  I had an extensive discussion with the patient regarding initiation of dialysis which he continues to refuse.  He appears to have capacity to make this decision. He states to me "I want to die.  I do not think I have anything to live for ".  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  He has told his assisted living facility that he would like transportation up to [place] where his family lives.  He tells me vehemently that he does not wish any extravagant measures on his life.  He states that he is DNR/DNI. 4:30PM  Spoke with Dr. Lorrene Reid with nephrology who recommends 120 mg IV Lasix dose in the ED and increasing his p.o. dose to 160 mg 3  times daily. 4:40PM Spoke with Dr. Linus Salmons with infectious diseases, who states that patient  does not require any treatment at this time.  However if his assisted living facility wishes, they can take him to the health department for latent TB treatment. 5:20PM Spoke with Santiago Glad with [facility] who agrees to assume care of patient and bring him into the hospital for further evaluation and management. Final Clinical Impressions(s) / ED Diagnoses   Final diagnoses:  Acute on chronic congestive heart failure, unspecified heart failure type [facility])  TB lung, latent    ED Discharge Orders    None       Debroah Baller 05/13/17 1731    Nat Christen, MD 05/14/17 1112

## 2017-05-13 NOTE — ED Triage Notes (Signed)
Pt in from [facility] via [facility] with a reported +TB blood test per nursing home. Pt denies any nightsweats, fevers or weight loss. States he has chronic cough, denies any bloody sputum. A&ox4, c/o BLE edema x few weeks. Lungs clear, denies sob. Sats 96% RA

## 2017-05-13 NOTE — H&P (Addendum)
History and Physical    Watt Geiler BJY:[id number] DOB: [birth year] DOA: 05/13/2017  PCP: System, Pcp Not In Patient coming from: facility  Chief Complaint: LE edema/dyspnea  HPI: Marcus Lane is a very pleasant 84 y.o. male with medical history significant for end-stage renal disease declining dialysis, hypertension, prostate cancer, history of polio presents to emergency Department chief complaint worsening lower extremity edema and shortness of breath. Initial evaluation reveals worsening renal function. Facility also reports +TB blood test. [facility] are asked to admit.  Information is obtained from the patient and the chart. He states over the last week or so he has developed gradual worsening lower extremity edema and shortness of breath. He states supposed to take 3 tabs of Lasix twice a day and he's not sure if he is getting his medications. Associated symptoms include orthopnea intermittent nausea decreased oral intake. He denies chest pain palpitations abdominal pain nausea vomiting. He does report a long history of urinary retention and he used to and out cath himself 2-3 times a day. Since he's been at the facility he reports the staff insists on doing it themselves. He denies headache dizziness syncope or near-syncope. He denies any cough night sweats fever chills. Denies diarrhea constipation melena bright red blood per rectum. Does report sensation of a full bladder and has much difficulty with urination.   ED Course: In the emergency department he's afebrile hemodynamically stable and not hypoxic  Review of Systems: As per HPI otherwise all other systems reviewed and are negative.   Ambulatory Status: Patient ambulates with a walker at the facility with an unsteady gait. Denies any recent falls  Past Medical History:  Diagnosis Date  . Cancer [facility]) 2017   prostate  . CKD (chronic kidney disease), stage III ([place])   . Clostridium difficile colitis 04/2017  .  Hypertension   . Polio    As a child    Past Surgical History:  Procedure Laterality Date  . CRYOABLATION N/A 06/09/2015   Procedure: CRYO ABLATION PROSTATE;  Surgeon: Carolan Clines, MD;  Location: [facility];  Service: Urology;  Laterality: N/A;  . orif arm Right 1963   accident w/surgical repair-rt arm    Social History   Socioeconomic History  . Marital status: Legally Separated    Spouse name: Not on file  . Number of children: Not on file  . Years of education: Not on file  . Highest education level: Not on file  Social Needs  . Financial resource strain: Not on file  . Food insecurity - worry: Not on file  . Food insecurity - inability: Not on file  . Transportation needs - medical: Not on file  . Transportation needs - non-medical: Not on file  Occupational History  . Not on file  Tobacco Use  . Smoking status: Light Tobacco Smoker    Packs/day: 0.25  . Smokeless tobacco: Never Used  Substance and Sexual Activity  . Alcohol use: Yes    Alcohol/week: 4.8 oz    Types: 8 Cans of beer per week  . Drug use: No  . Sexual activity: Not on file  Other Topics Concern  . Not on file  Social History Narrative  . Not on file    No Known Allergies  Family History  Problem Relation Age of Onset  . Diabetes Mother     Prior to Admission medications   Medication Sig Start Date End Date Taking? Authorizing Provider  aspirin EC 81 MG tablet Take 1  tablet (81 mg total) by mouth daily. 05/02/17 05/02/18  Caren Griffins, MD  calcium acetate (PHOSLO) 667 MG capsule Take 667 mg by mouth 3 (three) times daily.  03/15/17   [provider]  ferrous sulfate 325 (65 FE) MG tablet Take 325 mg by mouth 2 (two) times daily with a meal.    [provider]  fidaxomicin (DIFICID) 200 MG TABS tablet Take 1 tablet (200 mg total) by mouth 2 (two) times daily. For 6 days, end date 05/08/2017 05/02/17   Caren Griffins, MD  finasteride (PROSCAR) 5 MG  tablet Take 5 mg by mouth daily. 03/15/17   [provider]  furosemide (LASIX) 80 MG tablet Take 80 mg by mouth 3 (three) times daily. 03/15/17   [provider]  lovastatin (MEVACOR) 20 MG tablet Take 20 mg by mouth at bedtime.    [provider]  metoprolol tartrate (LOPRESSOR) 25 MG tablet Take 1 tablet (25 mg total) by mouth 2 (two) times daily. 05/02/17   Caren Griffins, MD  potassium chloride 20 MEQ/15ML (10%) SOLN Take 20 mEq by mouth 2 (two) times daily. 04/01/17   [provider]  sodium bicarbonate 650 MG tablet Take 1,300 mg by mouth 2 (two) times daily.  03/18/17   [provider]  tamsulosin (FLOMAX) 0.4 MG CAPS capsule Take 0.4 mg by mouth daily. 03/15/17   [provider]  triamcinolone cream (KENALOG) 0.5 % Apply 1 application topically 2 (two) times daily.  03/25/17   [provider]  VITAMIN E PO Take 1 capsule by mouth daily.    [provider]    Physical Exam: Vitals:   05/13/17 1030 05/13/17 1034 05/13/17 1232 05/13/17 1728  BP: (!) 169/97  (!) 162/87 (!) 156/96  Pulse: 64  (!) 55 66  Resp:   (!) 25 (!) 24  Temp:  98.2 F (36.8 C)    TempSrc:  Oral    SpO2: 97%  96% 93%  Weight:      Height:         General:  Appears calm and comfortable in no acute distress Eyes:  PERRL, EOMI, normal lids, iris ENT:  grossly normal hearing, lips & tongue, his membranes of his mouth are pink but dry Neck:  no LAD, masses or thyromegaly Cardiovascular:  RRR, no m/r/g. 3+LE edema.  Respiratory:  Mild increased work of breathing with conversation. Diminished breath sounds throughout with fine crackles bilateral bases. No wheeze Abdomen:  soft, ntnd, is a bowel sounds throughout no guarding or rebounding Skin:  no rash or induration seen on limited exam Musculoskeletal:  grossly normal tone BUE/BLE, good ROM, no bony abnormality Psychiatric:  grossly normal mood and affect, speech fluent and appropriate,  AOx3 Neurologic:  CN 2-12 grossly intact, moves all extremities in coordinated fashion, sensation intact speech clear facial symmetry  Labs on Admission: I have personally reviewed following labs and imaging studies  CBC: Recent Labs  Lab 05/13/17 1145  WBC 11.6*  NEUTROABS 8.8*  HGB 11.3*  HCT 35.7*  MCV 93.7  PLT 562   Basic Metabolic Panel: Recent Labs  Lab 05/13/17 1145  NA 143  K 4.7  CL 109  CO2 23  GLUCOSE 105*  BUN 48*  CREATININE 4.31*  CALCIUM 8.2*   GFR: Estimated Creatinine Clearance: 11.5 mL/min (A) (by C-G formula based on SCr of 4.31 mg/dL (H)). Liver Function Tests: No results for input(s): AST, ALT, ALKPHOS, BILITOT, PROT, ALBUMIN in the last  168 hours. No results for input(s): LIPASE, AMYLASE in the last 168 hours. No results for input(s): AMMONIA in the last 168 hours. Coagulation Profile: No results for input(s): INR, PROTIME in the last 168 hours. Cardiac Enzymes: No results for input(s): CKTOTAL, CKMB, CKMBINDEX, TROPONINI in the last 168 hours. BNP (last 3 results) No results for input(s): PROBNP in the last 8760 hours. HbA1C: No results for input(s): HGBA1C in the last 72 hours. CBG: No results for input(s): GLUCAP in the last 168 hours. Lipid Profile: No results for input(s): CHOL, HDL, LDLCALC, TRIG, CHOLHDL, LDLDIRECT in the last 72 hours. Thyroid Function Tests: No results for input(s): TSH, T4TOTAL, FREET4, T3FREE, THYROIDAB in the last 72 hours. Anemia Panel: No results for input(s): VITAMINB12, FOLATE, FERRITIN, TIBC, IRON, RETICCTPCT in the last 72 hours. Urine analysis:    Component Value Date/Time   COLORURINE YELLOW 04/09/2017 1947   APPEARANCEUR CLEAR 04/09/2017 1947   LABSPEC 1.013 04/09/2017 1947   PHURINE 6.0 04/09/2017 1947   GLUCOSEU 150 (A) 04/09/2017 1947   HGBUR MODERATE (A) 04/09/2017 [place] NEGATIVE 04/09/2017 1947   KETONESUR 5 (A) 04/09/2017 1947   PROTEINUR >=300 (A) 04/09/2017 1947   NITRITE  NEGATIVE 04/09/2017 1947   LEUKOCYTESUR NEGATIVE 04/09/2017 1947    Creatinine Clearance: Estimated Creatinine Clearance: 11.5 mL/min (A) (by C-G formula based on SCr of 4.31 mg/dL (H)).  Sepsis Labs: @LABRCNTIP (procalcitonin:4,lacticidven:4) )No results found for this or any previous visit (from the past 240 hour(s)).   Radiological Exams on Admission: Dg Chest 2 View  Result Date: 05/13/2017 CLINICAL DATA:  TB screening, productive cough for 1 week, hypertension, light smoker EXAM: CHEST - 2 VIEW COMPARISON:  02/24/2005 FINDINGS: Enlargement of cardiac silhouette with pulmonary vascular congestion. Atherosclerotic calcification aorta. Diffuse BILATERAL pulmonary infiltrates likely representing pulmonary edema. Bibasilar pleural effusions and mild atelectasis. No pneumothorax. Bones demineralized with dextroconvex thoracic scoliosis. IMPRESSION: Probable CHF. Electronically Signed   By: Lavonia Dana M.D.   On: 05/13/2017 11:16    EKG:   Assessment/Plan Principal Problem:   Acute heart failure (HCC) Active Problems:   End stage renal disease (HCC)   Anemia of chronic disease   Essential hypertension   DNR (do not resuscitate) discussion   Bilateral leg edema   Urinary retention   #1. Acute heart failure/dyspnea/LE edema likely diastolic related to end-stage renal disease in a patient that declines dialysis. Chest x-ray reveals probable CHF. BNP greater than 4000. Case discussed with nephrology who recommended 120 mg a Lasix IV now and then adjusting home dose 160 mg 3 times a day at discharge. Patient declines any further workup. Oxygen saturation level greater than 90% on room air. Agrees to palliative care consult -Admit for IV lasix -oxygen as needed -monitor BP- -monitor intake and output -palliative care consult  2. ESRD disease/urinary retention and known right hydronephroureter. Patient with a history of an in out cath. Resting 4.3 this appears to be close to his baseline.  Port sensation of full bladder requesting catheterization. Continues to decline dialysis. Discussed with nephrology who recommended 120 mg Lasix IV now and change his home dose 160 mg 3 times a day at discharge -see above -monitor intake and output -foley -Continue Flomax  #3. Hypertension. Blood pressure fairly controlled in the emergency department. Lasix as noted above -Monitor  #4. Anemia of on a disease. Hemoglobin appears stable -Continue iron  #5.? + TB test per facility. Case discussed with ID per ED provider. Dr. Linus Salmons opined patient does  not require any treatment at this time. Facility can take to health department for latent TB treatment   DVT prophylaxis: scd  Code Status: dnr  Family Communication: none present  Disposition Plan: back to facility tomorrow  Consults called: none  Admission status: obs    Radene Gunning MD [facility]  If 7PM-7AM, please contact night-coverage [email or web address] Password [facility]  05/13/2017, 6:03 PM

## 2017-05-13 NOTE — Progress Notes (Signed)
Date 05/13/2017 Time 2000  New Admission Note:   Arrival Method: stretcher Mental Orientation: alert and oriented x 4 Telemetry: not ordered Assessment: completed Skin: assessed by 2 RNs. Redness noted to sacrum. Foam dressing applied per protocol.  IV: right AC NSL Pain: denies Tubes: urinary catheter in place, leg band securement device applied Safety Measures: bed alarm on, bed in low position, call bell in reach Admission: completed 85M Orientation: completed Family: not at bedside Personal Belongings: clothing and cell phone at bedside  Orders have been reviewed and implemented. Will continue to monitor the patient.  Manya Silvas, RN MSN [place] Phone number: [phone]

## 2017-05-13 NOTE — ED Notes (Signed)
Called 5M to give report.  Receiving RN unable to take report at this time. 

## 2017-05-14 DIAGNOSIS — Z7189 Other specified counseling: Secondary | ICD-10-CM | POA: Diagnosis not present

## 2017-05-14 DIAGNOSIS — D638 Anemia in other chronic diseases classified elsewhere: Secondary | ICD-10-CM

## 2017-05-14 DIAGNOSIS — N186 End stage renal disease: Secondary | ICD-10-CM | POA: Diagnosis present

## 2017-05-14 DIAGNOSIS — Z515 Encounter for palliative care: Secondary | ICD-10-CM | POA: Diagnosis not present

## 2017-05-14 DIAGNOSIS — R338 Other retention of urine: Secondary | ICD-10-CM | POA: Diagnosis present

## 2017-05-14 DIAGNOSIS — R7611 Nonspecific reaction to tuberculin skin test without active tuberculosis: Secondary | ICD-10-CM | POA: Diagnosis present

## 2017-05-14 DIAGNOSIS — Z66 Do not resuscitate: Secondary | ICD-10-CM | POA: Diagnosis present

## 2017-05-14 DIAGNOSIS — R600 Localized edema: Secondary | ICD-10-CM | POA: Diagnosis not present

## 2017-05-14 DIAGNOSIS — I132 Hypertensive heart and chronic kidney disease with heart failure and with stage 5 chronic kidney disease, or end stage renal disease: Secondary | ICD-10-CM | POA: Diagnosis present

## 2017-05-14 DIAGNOSIS — Z8612 Personal history of poliomyelitis: Secondary | ICD-10-CM | POA: Diagnosis not present

## 2017-05-14 DIAGNOSIS — I509 Heart failure, unspecified: Secondary | ICD-10-CM

## 2017-05-14 DIAGNOSIS — Z7982 Long term (current) use of aspirin: Secondary | ICD-10-CM | POA: Diagnosis not present

## 2017-05-14 DIAGNOSIS — I501 Left ventricular failure: Secondary | ICD-10-CM | POA: Diagnosis present

## 2017-05-14 DIAGNOSIS — D631 Anemia in chronic kidney disease: Secondary | ICD-10-CM | POA: Diagnosis present

## 2017-05-14 DIAGNOSIS — Z8546 Personal history of malignant neoplasm of prostate: Secondary | ICD-10-CM | POA: Diagnosis not present

## 2017-05-14 DIAGNOSIS — Z87891 Personal history of nicotine dependence: Secondary | ICD-10-CM | POA: Diagnosis not present

## 2017-05-14 DIAGNOSIS — I5021 Acute systolic (congestive) heart failure: Secondary | ICD-10-CM | POA: Diagnosis not present

## 2017-05-14 DIAGNOSIS — Z833 Family history of diabetes mellitus: Secondary | ICD-10-CM | POA: Diagnosis not present

## 2017-05-14 LAB — MRSA PCR SCREENING: MRSA by PCR: NEGATIVE

## 2017-05-14 MED ORDER — FUROSEMIDE 10 MG/ML IJ SOLN
120.0000 mg | Freq: Once | INTRAVENOUS | Status: DC
  Filled 2017-05-14: qty 12

## 2017-05-14 MED ORDER — HEPARIN SODIUM (PORCINE) 5000 UNIT/ML IJ SOLN
5000.0000 [IU] | Freq: Three times a day (TID) | INTRAMUSCULAR | Status: DC
  Filled 2017-05-14 (×2): qty 1

## 2017-05-14 NOTE — Consult Note (Signed)
Consultation Note Date: 05/14/2017   Patient Name: Marcus Lane  DOB: 1931/05/10  MRN: [id number]  Age / Sex: 84 y.o., male  PCP: System, Pcp Not In Referring Physician: Domenic Polite, MD  Reason for Consultation: Establishing goals of care  HPI/Patient Profile: 84 y.o. male  with past medical history of ESRD declining dialysis, HTN, AOCD, prostate cancer, and h/o polio, who was admitted on 05/13/2017 with volume overload and worsening lower extremity edema thought secondary to A/CKD. He also has possible + TB test while at SNF. Palliative care has been asked to assist with establishing goals.   Clinical Assessment and Goals of Care: I met with patient who says he was previous living independently in an apartment but functionally declined and was no longer able to care for himself at home. He has most recently been a resident at [facility]. He says he has five children that are not very involved in his care. However, he says he has been trying to establish a plan to transfer to [place] to be closer to his children. Patient says he hopes to be able to return to SNF upon discharge. Will ask SW to assist with disposition.   Patient confirms that he would not want to be started on dialysis and says he recognizes the significance of this decision as being possible decline and death. We talked about hospice involvement at SNF and he seemed receptive of the idea. I would recommend a hospice referral upon discharge.   I called and spoke with nurse at [facility], who says that at baseline, patient is ambulatory with a walker and requires assistance with ADLs. She said that patient had a +PPD, quantiferon, and CXR and will fax the results of these tests to hospital.   SUMMARY OF RECOMMENDATIONS   1. Continue supportive care.  2. DNR  3. Recommend hospice referral at SNF 4. SW consult to help with disposition     Discharge Planning: [place] with Hospice      Primary Diagnoses: Present on Admission: . Anemia of chronic disease . End stage renal disease ([place]) . Essential hypertension . Bilateral leg edema . Urinary retention . ESRD (end stage renal disease) ([place])   I have reviewed the medical record, interviewed the patient and family, and examined the patient. The following aspects are pertinent.  Past Medical History:  Diagnosis Date  . Cancer [facility]) 2017   prostate  . CKD (chronic kidney disease), stage III ([place])   . Clostridium difficile colitis 04/2017  . Hypertension   . Polio    As a child   Social History   Socioeconomic History  . Marital status: Legally Separated    Spouse name: None  . Number of children: None  . Years of education: None  . Highest education level: None  Social Needs  . Financial resource strain: None  . Food insecurity - worry: None  . Food insecurity - inability: None  . Transportation needs - medical: None  . Transportation needs - non-medical: None  Occupational  History  . None  Tobacco Use  . Smoking status: Light Tobacco Smoker    Packs/day: 0.25  . Smokeless tobacco: Never Used  Substance and Sexual Activity  . Alcohol use: Yes    Alcohol/week: 4.8 oz    Types: 8 Cans of beer per week  . Drug use: No  . Sexual activity: None  Other Topics Concern  . None  Social History Narrative  . None   Family History  Problem Relation Age of Onset  . Diabetes Mother    Scheduled Meds: . aspirin EC  81 mg Oral Daily  . calcium acetate  667 mg Oral TID WC  . ferrous sulfate  325 mg Oral BID WC  . finasteride  5 mg Oral Daily  . heparin injection (subcutaneous)  5,000 Units Subcutaneous Q8H  . metoprolol tartrate  25 mg Oral BID  . potassium chloride  20 mEq Oral BID  . pravastatin  20 mg Oral q1800  . sodium bicarbonate  1,300 mg Oral BID  . tamsulosin  0.4 mg Oral Daily   Continuous Infusions: PRN  Meds:.acetaminophen **OR** acetaminophen, ondansetron **OR** ondansetron (ZOFRAN) IV Medications Prior to Admission:  Prior to Admission medications   Medication Sig Start Date End Date Taking? Authorizing Provider  aspirin EC 81 MG tablet Take 1 tablet (81 mg total) by mouth daily. 05/02/17 05/02/18 Yes Gherghe, Vella Redhead, MD  calcium acetate (PHOSLO) 667 MG capsule Take 667 mg by mouth 3 (three) times daily. 8a, 1p, 8p 03/15/17  Yes [provider]  ferrous sulfate 325 (65 FE) MG tablet Take 325 mg by mouth daily.    Yes [provider]  finasteride (PROSCAR) 5 MG tablet Take 5 mg by mouth every evening.  03/15/17  Yes [provider]  furosemide (LASIX) 80 MG tablet Take 80 mg by mouth 3 (three) times daily. 8a, 1p, 5p 03/15/17  Yes [provider]  lovastatin (MEVACOR) 20 MG tablet Take 20 mg by mouth at bedtime.   Yes [provider]  Melatonin 3 MG TABS Take 3 mg by mouth at bedtime.   Yes [provider]  metoprolol tartrate (LOPRESSOR) 25 MG tablet Take 1 tablet (25 mg total) by mouth 2 (two) times daily. Patient taking differently: Take 25 mg by mouth 2 (two) times daily before a meal.  05/02/17  Yes Gherghe, Vella Redhead, MD  potassium chloride 20 MEQ/15ML (10%) SOLN Take 20 mEq by mouth 2 (two) times daily. 04/01/17  Yes [provider]  sodium bicarbonate 650 MG tablet Take 1,300 mg by mouth 2 (two) times daily before a meal.  03/18/17  Yes [provider]  tamsulosin (FLOMAX) 0.4 MG CAPS capsule Take 0.4 mg by mouth every evening.  03/15/17  Yes [provider]  vitamin E 1000 UNIT capsule Take 1,000 Units by mouth daily.   Yes [provider]   No Known Allergies Review of Systems  Physical Exam  Constitutional: He is oriented to person, place, and time. He appears well-developed and well-nourished.  Neck: Normal range of motion.  Cardiovascular: Normal rate, regular rhythm and normal heart sounds.   Pulmonary/Chest: Effort normal and breath sounds normal.  Abdominal: Soft. Bowel sounds are normal.  Genitourinary:  Genitourinary Comments: Foley   Musculoskeletal:  weakness  Neurological: He is alert and oriented to person, place, and time.  Skin: Skin is warm and dry.    Vital Signs: BP (!) 157/69 (BP Location: Right Arm)   Pulse 63  Temp 98.4 F (36.9 C) (Oral)   Resp 20   Ht _0  (1.676 m)   Wt 72.5 kg (159 lb 13.3 oz)   SpO2 96%   BMI 25.80 kg/m  Pain Assessment: No/denies pain       SpO2: SpO2: 96 % O2 Device:SpO2: 96 % O2 Flow Rate: .   IO: Intake/output summary:   Intake/Output Summary (Last 24 hours) at 05/14/2017 1400 Last data filed at 05/14/2017 0900 Gross per 24 hour  Intake 240 ml  Output 2250 ml  Net -2010 ml    LBM:   Baseline Weight: Weight: 75.3 kg (166 lb) Most recent weight: Weight: 72.5 kg (159 lb 13.3 oz)     Palliative Assessment/Data:     Time In: 1330 Time Out: 1400 Time Total: 30 minutes Greater than 50%  of this time was spent counseling and coordinating care related to the above assessment and plan.  Signed by: Irean Hong, NP   Please contact Palliative Medicine Team phone at [phone] for questions and concerns.  For individual provider: See Shea Evans

## 2017-05-14 NOTE — Progress Notes (Signed)
Nurse about to give his medications,stating his meds and what it does to him.Patient refused all his scheduled medications and said to this writer '' I don't want anymore medicines,nor treatment. I want to die .Can you ask the doctor to come back ".Nurse reconfirmed his statement and said the same thing to me and added ''I just made a decision few minutes ago".Will made the M.D. Aware of this.

## 2017-05-14 NOTE — Progress Notes (Signed)
PROGRESS NOTE    Marcus Lane  MHD:[id number] DOB: 06/10/1931 DOA: 05/13/2017 PCP: System, Pcp Not In  Brief Narrative:Marcus Lane is a very pleasant 84 y.o. male with medical history significant for end-stage renal disease declining dialysis, hypertension, prostate cancer, history of polio presents to emergency Department chief complaint worsening lower extremity edema and shortness of breath. Initial evaluation reveals worsening renal function. Facility also reports +TB blood test.   Assessment & Plan:   Volume overload/progressive CK D4  -Patient is close to requiring hemodialysis however he has declined this in full consciousness in the past as well as currently -Case was discussed with renal yesterday who recommended high-dose IV Lasix followed by 160 mg 3 times a day at discharge -We'll give him 120 mg IV Lasix again today  -He is 2L negative, monitor urine output  -Palliative medicine consulted for goals of care, he will need hospice in the near future   hypertension -Improved, monitor with diuresis  Anemia of chronic disease -Stable, monitor, continue iron  Question positive TB test -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 Arelia Longest on admission who opined that patient does not require any treatment at this time and he could be referred to the health department for latent TB evaluation or treatment  DVT prophylaxis: Hep SQ Code Status: DNR Family Communication: No family at bedside Disposition Plan: to be determined  Consultants:   None  D/w Renal  D/w ID Dr.Comer   Procedures:   Antimicrobials:    Subjective: -breathing better, urinated a lot yesterday, swelling improving too  Objective: Vitals:   05/13/17 1728 05/13/17 2000 05/14/17 0449 05/14/17 0947   BP: (!) 156/96 (!) 162/80 (!) 157/64 (!) 157/69  Pulse: 66 83 64 63  Resp: (!) 24 (!) 22 20 20   Temp:  98.2 F (36.8 C) 98.2 F (36.8 C) 98.4 F (36.9 C)  TempSrc:  Oral Oral Oral  SpO2: 93% 96% 96% 96%  Weight:  72.5 kg (159 lb 13.3 oz)    Height:  5\' 6"  (1.676 m)      Intake/Output Summary (Last 24 hours) at 05/14/2017 1026 Last data filed at 05/14/2017 0900 Gross per 24 hour  Intake 240 ml  Output 2250 ml  Net -2010 ml   Filed Weights   05/13/17 1029 05/13/17 2000  Weight: 75.3 kg (166 lb) 72.5 kg (159 lb 13.3 oz)    Examination:  General exam: Appears calm and comfortable, no distress Respiratory system: Decreased breath sounds at both bases Cardiovascular system: S1 & S2 heard, RRR.  Gastrointestinal system: Abdomen is nondistended, soft and nontender.Normal bowel sounds heard. Central nervous system: Alert and oriented. No focal neurological deficits. Extremities: 2+ edema Skin: No rashes, lesions or ulcers Psychiatry: Judgement and insight appear normal. Mood & affect appropriate.     Data Reviewed:   CBC: Recent Labs  Lab 05/13/17 1145  WBC 11.6*  NEUTROABS 8.8*  HGB 11.3*  HCT 35.7*  MCV 93.7  PLT 211   Basic Metabolic Panel: Recent Labs  Lab 05/13/17 1145  NA 143  K 4.7  CL 109  CO2 23  GLUCOSE 105*  BUN 48*  CREATININE 4.31*  CALCIUM 8.2*   GFR: Estimated Creatinine Clearance: 11.5 mL/min (A) (by C-G formula based on SCr of 4.31 mg/dL (H)). Liver Function  Tests: No results for input(s): AST, ALT, ALKPHOS, BILITOT, PROT, ALBUMIN in the last 168 hours. No results for input(s): LIPASE, AMYLASE in the last 168 hours. No results for input(s): AMMONIA in the last 168 hours. Coagulation Profile: No results for input(s): INR, PROTIME in the last 168 hours. Cardiac Enzymes: No results for input(s): CKTOTAL, CKMB, CKMBINDEX, TROPONINI in the last 168 hours. BNP (last 3 results) No results for input(s): PROBNP in the last 8760  hours. HbA1C: No results for input(s): HGBA1C in the last 72 hours. CBG: No results for input(s): GLUCAP in the last 168 hours. Lipid Profile: No results for input(s): CHOL, HDL, LDLCALC, TRIG, CHOLHDL, LDLDIRECT in the last 72 hours. Thyroid Function Tests: No results for input(s): TSH, T4TOTAL, FREET4, T3FREE, THYROIDAB in the last 72 hours. Anemia Panel: No results for input(s): VITAMINB12, FOLATE, FERRITIN, TIBC, IRON, RETICCTPCT in the last 72 hours. Urine analysis:    Component Value Date/Time   COLORURINE YELLOW 05/13/2017 1805   APPEARANCEUR CLEAR 05/13/2017 1805   LABSPEC 1.015 05/13/2017 1805   PHURINE 7.0 05/13/2017 1805   GLUCOSEU 150 (A) 05/13/2017 1805   HGBUR NEGATIVE 05/13/2017 1805   BILIRUBINUR NEGATIVE 05/13/2017 1805   KETONESUR NEGATIVE 05/13/2017 1805   PROTEINUR >=300 (A) 05/13/2017 1805   NITRITE POSITIVE (A) 05/13/2017 1805   LEUKOCYTESUR NEGATIVE 05/13/2017 1805   Sepsis Labs: @LABRCNTIP (procalcitonin:4,lacticidven:4)  ) Recent Results (from the past 240 hour(s))  MRSA PCR Screening     Status: None   Collection Time: 05/13/17  8:55 PM  Result Value Ref Range Status   MRSA by PCR NEGATIVE NEGATIVE Final    Comment:        The GeneXpert MRSA Assay (FDA approved for NASAL specimens only), is one component of a comprehensive MRSA colonization surveillance program. It is not intended to diagnose MRSA infection nor to guide or monitor treatment for MRSA infections. Performed at [facility], [address] [place], [place] [address]          Radiology Studies: Dg Chest 2 View  Result Date: 05/13/2017 CLINICAL DATA:  TB screening, productive cough for 1 week, hypertension, light smoker EXAM: CHEST - 2 VIEW COMPARISON:  02/24/2005 FINDINGS: Enlargement of cardiac silhouette with pulmonary vascular congestion. Atherosclerotic calcification aorta. Diffuse BILATERAL pulmonary infiltrates likely representing pulmonary edema. Bibasilar  pleural effusions and mild atelectasis. No pneumothorax. Bones demineralized with dextroconvex thoracic scoliosis. IMPRESSION: Probable CHF. Electronically Signed   By: Lavonia Dana M.D.   On: 05/13/2017 11:16        Scheduled Meds: . aspirin EC  81 mg Oral Daily  . calcium acetate  667 mg Oral TID WC  . ferrous sulfate  325 mg Oral BID WC  . finasteride  5 mg Oral Daily  . metoprolol tartrate  25 mg Oral BID  . potassium chloride  20 mEq Oral BID  . pravastatin  20 mg Oral q1800  . sodium bicarbonate  1,300 mg Oral BID  . tamsulosin  0.4 mg Oral Daily   Continuous Infusions: . furosemide 120 mg (05/14/17 1024)     LOS: 0 days    Time spent: 49min    Domenic Polite, MD [facility] Page via [email or web address], password [email or web address] After 7PM please contact night-coverage  05/14/2017, 10:26 AM

## 2017-05-14 NOTE — Progress Notes (Signed)
Pt states he does not want any more medications and refused them. Pt is resting comfortably. Will continue to monitor.   Eleanora Neighbor, RN

## 2017-05-14 NOTE — Progress Notes (Signed)
Discontinued Lasix 160 mg x daily as verbal ordered by Dr.P. Broadus John.

## 2017-05-15 DIAGNOSIS — I509 Heart failure, unspecified: Secondary | ICD-10-CM

## 2017-05-15 MED ORDER — MORPHINE SULFATE (CONCENTRATE) 10 MG /0.5 ML PO SOLN: 5.0000 mg | ORAL | 0 refills | Status: AC | PRN

## 2017-05-15 MED ORDER — ACETAMINOPHEN 325 MG PO TABS: 650.0000 mg | ORAL_TABLET | Freq: Four times a day (QID) | ORAL | Status: AC | PRN

## 2017-05-15 NOTE — Progress Notes (Signed)
Pt refused all vitals and care

## 2017-05-15 NOTE — Progress Notes (Signed)
[facility] Liaison RN note  Received request from [place], [place] for family interest in [facility]. Chart reviewed and spoke with son, Larena Glassman by phone to acknowledge referral. Unfortunately [facility] is not able to offer a room today. Family and CSW are aware [facility] liaison will follow up tomorrow with CSW and family of room becomes available. Please do no hesitate to call with questions.  Thank you,  Farrel Gordon, RN, [facility] Liaison [phone]  [facility] hospital liaisons are on [place]

## 2017-05-15 NOTE — Clinical Social Work Note (Signed)
CSW spoke with pt's son as pt was having a hard time understanding the situation. Pt's son spoke with father and all are agreeable to hospice placement. Pt's son prefer pt go to hospice home in [place]. Referral made. Mary to follow up with pt and pt's family.   [place], [place]

## 2017-05-15 NOTE — Discharge Summary (Addendum)
Physician Discharge Summary  Marcus Lane OEV:[id number] DOB: [birth year] DOA: 05/13/2017  PCP: System, Pcp Not In  Admit date: 05/13/2017 Discharge date: 05/17/2017  Time spent: 35 minutes  Recommendations for Outpatient Follow-up:  1. Residential hospice for end-of-life care   Discharge Diagnoses:    Pulmonary edema   End stage renal disease (Marcus Lane)   Anemia of chronic disease   Essential hypertension   DNR (do not resuscitate) discussion   Bilateral leg edema   ESRD (end stage renal disease) (Marcus Lane)   Urinary retention   Acute on chronic congestive heart failure Marcus Lane)   Discharge Condition: Poor  Diet recommendation: Comfort feeds  Filed Weights   05/13/17 1029 05/13/17 2000 05/14/17 2030  Weight: 75.3 kg (166 lb) 72.5 kg (159 lb 13.3 oz) 77 kg (169 lb 12.1 oz)    History of present illness:  Marcus Romerois a very pleasant84 y.o.malewith medical history significantforend-stage renal disease declining dialysis, hypertension, prostate cancer, history of polio presents to emergency Department chief complaint worsening lower extremity edema and shortness of breath  Hospital Course:   Pulmonary edema/progressive CK D4  near end-stage renal disease -Patient is close to requiring hemodialysis however he has declined this in full consciousness in the past as well as currently -Case was discussed with renal on admission who recommended high-dose IV Lasix followed by 160 mg 3 times a day at discharge He was given 120 mg of IV Lasix on admission and had 2 L of urine output after this which caused modest improvement in symptoms -Subsequently patient has declined all medical treatments and interventions as he understands his time is limited and wishes to focus on comfort only -Palliative medicine consulted for goals of care, goals of care meeting was completed, plan for comfort care only, plan for residential hospice as his prognosis is less than 2 weeks    hypertension -Improved  Anemia of chronic disease -Stable, now comfort care  Question positive TB test -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 Arelia Lane on admission who opined that patient does not require any treatment at this time and he could be referred to the health department for latent TB evaluation or treatment -This is not relevant in current circumstances with end-of-life care     Consultations:  Palliative medicine  Discharge Exam: Vitals:   05/14/17 2030 05/15/17 0429  BP:  (!) 148/75  Pulse: 77 76  Resp: 16 19  Temp: 99 F (37.2 C) 98.4 F (36.9 C)  SpO2: 93% 93%    General: Alert awake oriented 2 Cardiovascular: S1-S2/regular rate rhythm, tachycardic Respiratory: Fine basilar crackles  Discharge Instructions    Allergies as of 05/15/2017   No Known Allergies     Medication List    STOP taking these medications   aspirin EC 81 MG tablet   calcium acetate 667 MG capsule Commonly known as:  PHOSLO   ferrous sulfate 325 (65 FE) MG tablet   furosemide 80 MG tablet Commonly known as:  LASIX   lovastatin 20 MG tablet Commonly known as:  MEVACOR   metoprolol tartrate 25 MG tablet Commonly known as:  LOPRESSOR   potassium chloride 20 MEQ/15ML (10%) Soln   sodium bicarbonate 650 MG tablet   vitamin E 1000 UNIT capsule     TAKE these medications   acetaminophen 325 MG tablet  Commonly known as:  TYLENOL Take 2 tablets (650 mg total) by mouth every 6 (six) hours as needed for mild pain (or Fever >/= 101).   finasteride 5 MG tablet Commonly known as:  PROSCAR Take 5 mg by mouth every evening.   Melatonin 3 MG Tabs Take 3 mg by mouth at bedtime.   morphine CONCENTRATE 10 mg / 0.5 ml concentrated solution Take 0.25 mLs (5 mg total) by mouth every 4  (four) hours as needed for severe pain.   tamsulosin 0.4 MG Caps capsule Commonly known as:  FLOMAX Take 0.4 mg by mouth every evening.      No Known Allergies    The results of significant diagnostics from this hospitalization (including imaging, microbiology, ancillary and laboratory) are listed below for reference.    Significant Diagnostic Studies: Ct Abdomen Pelvis Wo Contrast  Result Date: 04/28/2017 CLINICAL DATA:  84 year old male with abdominal pain. Concern for colitis or gastroenteritis. EXAM: CT ABDOMEN AND PELVIS WITHOUT CONTRAST TECHNIQUE: Multidetector CT imaging of the abdomen and pelvis was performed following the standard protocol without IV contrast. COMPARISON:  Abdominal CT dated 04/09/2017 FINDINGS: Evaluation of this exam is limited in the absence of intravenous contrast. Lower chest: Partially visualized small right and probable trace left pleural effusion. There is hypoattenuation of the cardiac blood pool suggestive of a degree of anemia. Clinical correlation is recommended. There is coronary vascular calcification. No intra-abdominal free air or free fluid. Hepatobiliary: The liver is unremarkable. No intrahepatic biliary ductal dilatation. There is probable small amount of sludge within the gallbladder. No calcified stone. No pericholecystic fluid. Pancreas: Unremarkable. No pancreatic ductal dilatation or surrounding inflammatory changes. Spleen: Normal in size without focal abnormality. Adrenals/Urinary Tract: The adrenal glands are unremarkable. There is mild right hydronephroureter. No obstructing stone. The left kidney is unremarkable the urinary bladder is partially distended and appears grossly unremarkable for the degree of distention Stomach/Bowel: There is diffuse thickened and inflamed appearance of the colon consistent with pancolitis. Correlation with clinical exam and stool cultures recommended there is no bowel obstruction. The appendix is normal.  Vascular/Lymphatic: Advanced aortoiliac atherosclerotic disease. No portal venous gas. There is no adenopathy. Reproductive: The prostate gland is enlarged and somewhat asymmetric and nodular. It measures approximately 6 cm in transverse diameter. Clinical correlation is recommended. Other: There is diffuse stranding of the mesentery and subcutaneous edema. Small amount of fluid noted in the left inguinal canal. Musculoskeletal: Degenerative changes of the spine. No acute osseous pathology. IMPRESSION: 1. Pancolitis. Correlation with clinical exam and stool cultures recommended. No bowel obstruction. Normal appendix. 2. Partially visualized small pleural effusions. Diffuse mesenteric edema, and anasarca 3. Anemia. 4. Enlarged prostate gland. 5. Mild right hydronephroureter relatively similar to prior CT. 6. Advanced Aortic Atherosclerosis (ICD10-I70.0). Electronically Signed   By: Anner Crete M.D.   On: 04/28/2017 23:00   Dg Chest 2 View  Result Date: 05/13/2017 CLINICAL DATA:  TB screening, productive cough for 1 week, hypertension, light smoker EXAM: CHEST - 2 VIEW COMPARISON:  02/24/2005 FINDINGS: Enlargement of cardiac silhouette with pulmonary vascular congestion. Atherosclerotic calcification aorta. Diffuse BILATERAL pulmonary infiltrates likely representing pulmonary edema. Bibasilar pleural effusions and mild atelectasis. No pneumothorax. Bones demineralized with dextroconvex thoracic scoliosis. IMPRESSION: Probable CHF. Electronically Signed   By: Lavonia Dana M.D.   On: 05/13/2017 11:16    Microbiology: Recent Results (from the past 240 hour(s))  MRSA PCR Screening     Status: None   Collection Time: 05/13/17  8:55 PM  Result  Value Ref Range Status   MRSA by PCR NEGATIVE NEGATIVE Final    Comment:        The GeneXpert MRSA Assay (FDA approved for NASAL specimens only), is one component of a comprehensive MRSA colonization surveillance program. It is not intended to diagnose  MRSA infection nor to guide or monitor treatment for MRSA infections. Performed at [facility], [place] [address] [place], [place] [address]      Labs: Basic Metabolic Panel: Recent Labs  Lab 05/13/17 1145  NA 143  K 4.7  CL 109  CO2 23  GLUCOSE 105*  BUN 48*  CREATININE 4.31*  CALCIUM 8.2*   Liver Function Tests: No results for input(s): AST, ALT, ALKPHOS, BILITOT, PROT, ALBUMIN in the last 168 hours. No results for input(s): LIPASE, AMYLASE in the last 168 hours. No results for input(s): AMMONIA in the last 168 hours. CBC: Recent Labs  Lab 05/13/17 1145  WBC 11.6*  NEUTROABS 8.8*  HGB 11.3*  HCT 35.7*  MCV 93.7  PLT 176   Cardiac Enzymes: No results for input(s): CKTOTAL, CKMB, CKMBINDEX, TROPONINI in the last 168 hours. BNP: BNP (last 3 results) Recent Labs    05/13/17 1145  BNP 4,424.7*    ProBNP (last 3 results) No results for input(s): PROBNP in the last 8760 hours.  CBG: No results for input(s): GLUCAP in the last 168 hours.     Signed:  Domenic Polite MD.  [facility] 05/15/2017, 12:49 PM

## 2017-05-16 NOTE — Clinical Social Work Note (Signed)
Referral made to [facility] as [facility] has no beds today. CSW reviewed note from [place] with [facility] and talked with her by phone (4:38 pm). CSW will follow-up with [facility] liaison, patient and/or family on 3/12.  Ginger Leeth Givens, MSW, LCSW Licensed Clinical Social Worker [place] [phone]

## 2017-05-16 NOTE — Progress Notes (Signed)
Pt is refusing care and all medications.

## 2017-05-16 NOTE — Progress Notes (Signed)
Pt refused all vitals being done

## 2017-05-16 NOTE — Progress Notes (Signed)
PT seen, no changes Awaiting [facility]  Domenic Polite, MD

## 2017-05-16 NOTE — Progress Notes (Signed)
[place]:  Met with pt and discussed his goals and hospice care. He reports he is not taking any medications because he doesn't want to live like this anymore. He is ready to die. Long discussion about comfort care and how we would help with his symptoms as he transition to end of life care. He is in agreement to this. I spoke to him about levels of care at hospice home. He is unsure he wants to go to the [facility] because he could possibly get a bill. He has now decided after talking with his son Larena Glassman that he may want to go home with a friend Ranuifo Deniece Ree. This friend will be visiting today at 500pm and he is going to ask him can he go to his house and get hospice care. If his friend is in agreement he would rather do this instead of going to hospice home. I have also spoke to both brothers as well. Larena Glassman verbalizes understanding and states that if his dad can go home with friend they prefer this however if he can not because the friend is not comfortable with this then they will come up with a different plan and reconsider hospice home. The son Larena Glassman is suppose to call me at 0900am in the morning to let me know what they have decided. Please call with questions and let me know how we can help further. Webb Silversmith RN [phone]

## 2017-05-17 NOTE — Progress Notes (Signed)
Patient discharged to Hospice. After visit Summary reviewed.  No signs and symptoms of distress noted. Patient educated to return to the ED in the case of an emergency. [place] K Marcus Lane

## 2017-05-17 NOTE — Progress Notes (Signed)
Appears more frail, [facility]idential H[facility] today  Marcus Polite, MD

## 2017-05-17 NOTE — Social Work (Addendum)
  Patient is medically stable for discharge to Hospice home in [facility]. [name], son contacted [phone]) and informed of discharge. Mr. Marcus Lane informed [place] intern that he lives in [place] state and wanted to move the patient  to [place] to live with him but he is not financially able to care for him. Mr. Marcus Lane will contact his brother who will visit patient at hospice facility later this evening. CSW intern signing off as no other social work intervention services needed.   Marcus Lane, [place] intern

## 2017-06-06 DEATH — deceased

## 2018-09-13 IMAGING — CT CT ABD-PELV W/O CM
2 of 4 series · 16 of 46 positions shown, 18 images · non-contrast
Comparison: Abdominal CT dated 04/09/2017

CLINICAL DATA: 86-year-old male with abdominal pain. Concern for
colitis or gastroenteritis.

EXAM:
CT ABDOMEN AND PELVIS WITHOUT CONTRAST
TECHNIQUE: Multidetector CT imaging of the abdomen and pelvis was performed
following the standard protocol without IV contrast.

[Series 3: abd/ pelvis 5.0 i30f 2 · axial · 0.89mm/px · z∈[+790,+1190]mm · 13 of 88 slices shown, 15 images]
[im 4/88  soft-tissue]
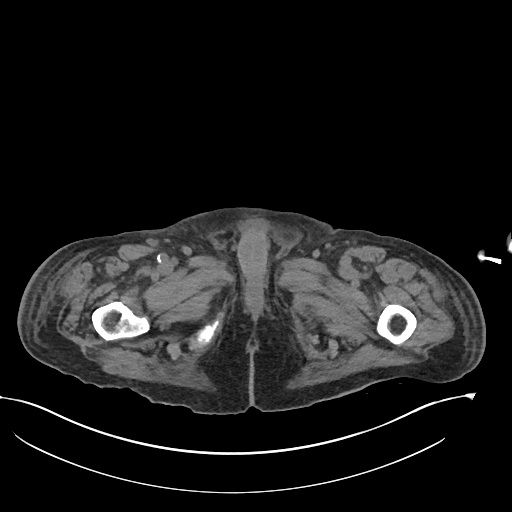
[im 4/88  bone]
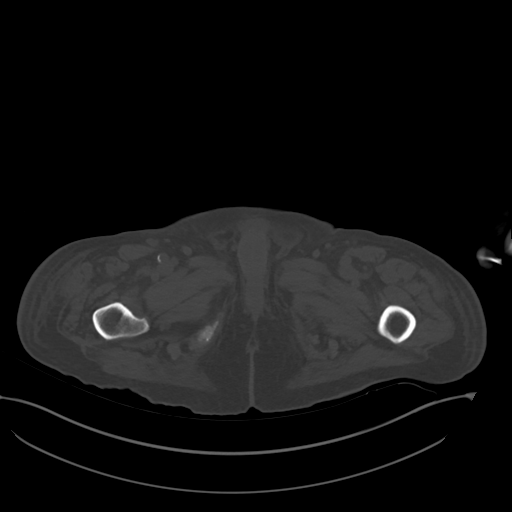
[im 11/88  soft-tissue]
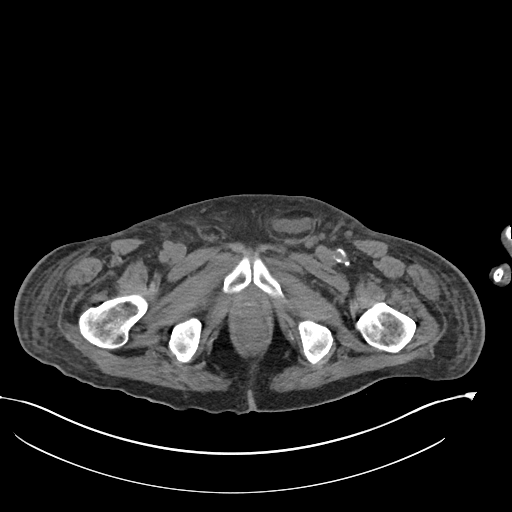
[im 19/88  soft-tissue]
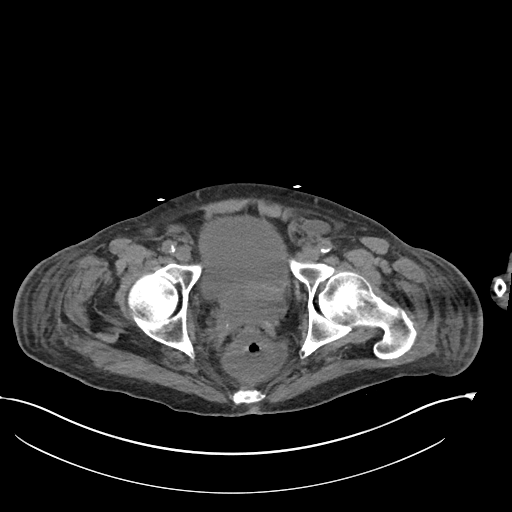
[im 26/88  soft-tissue]
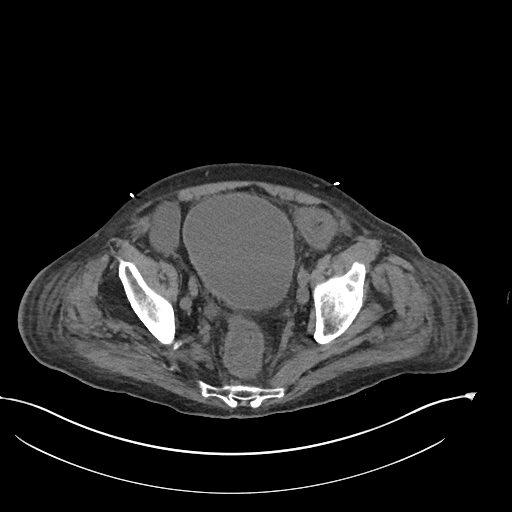
[im 30/88  soft-tissue]
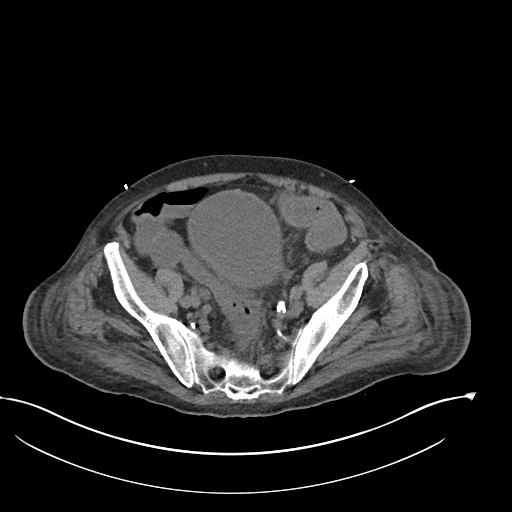
[im 37/88  soft-tissue]
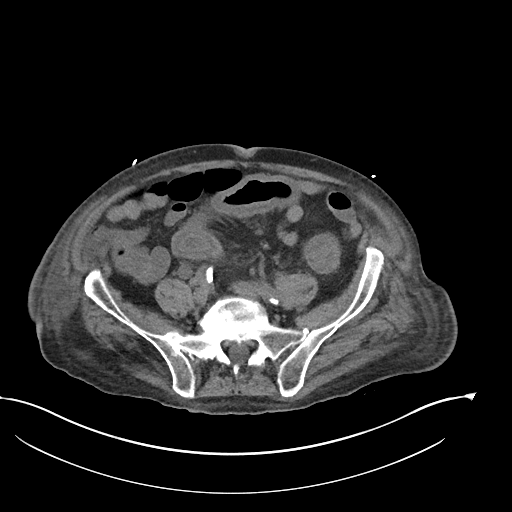
[im 44/88  soft-tissue]
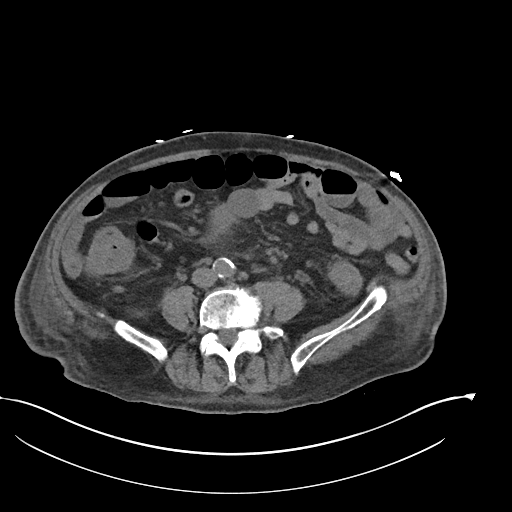
[im 51/88  soft-tissue]
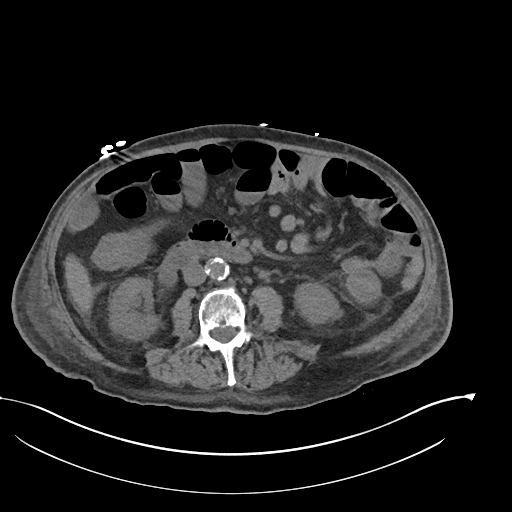
[im 59/88  soft-tissue]
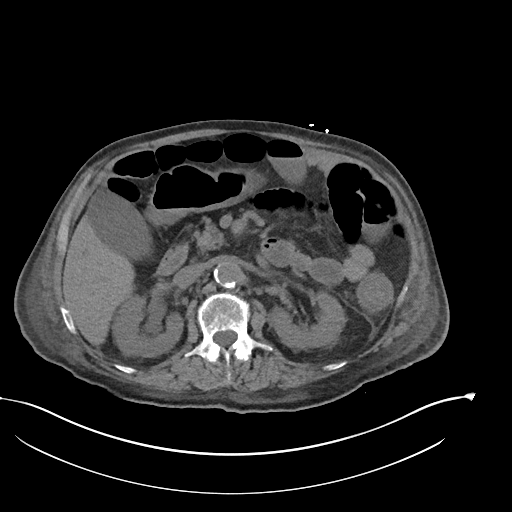
[im 59/88  bone]
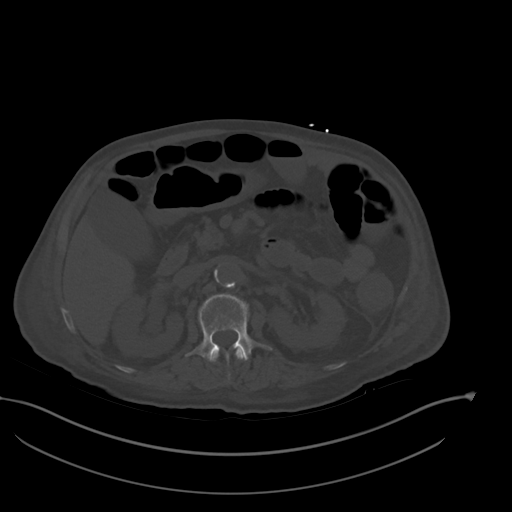
[im 62/88  soft-tissue]
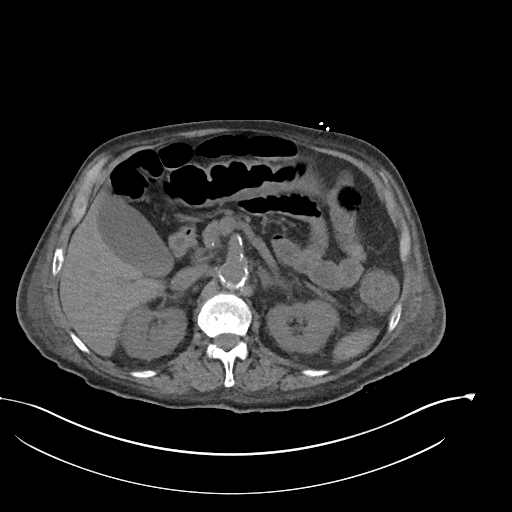
[im 69/88  soft-tissue]
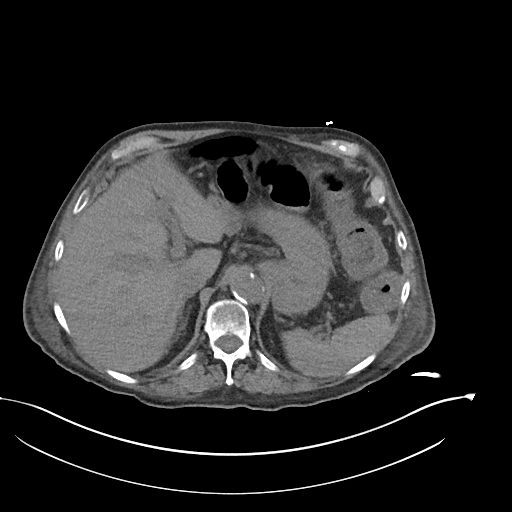
[im 77/88  soft-tissue]
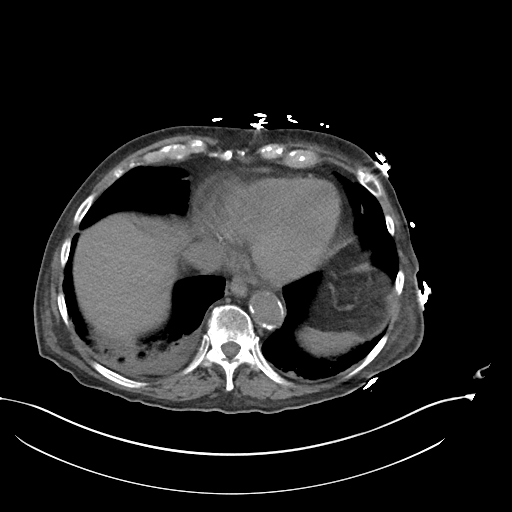
[im 84/88  soft-tissue]
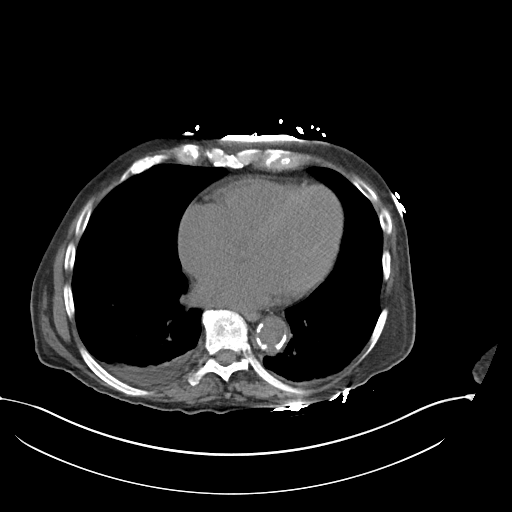

[Series 6: cor st · coronal · 0.81mm/px · 3 of 93 slices shown]
[im 31/93  soft-tissue]
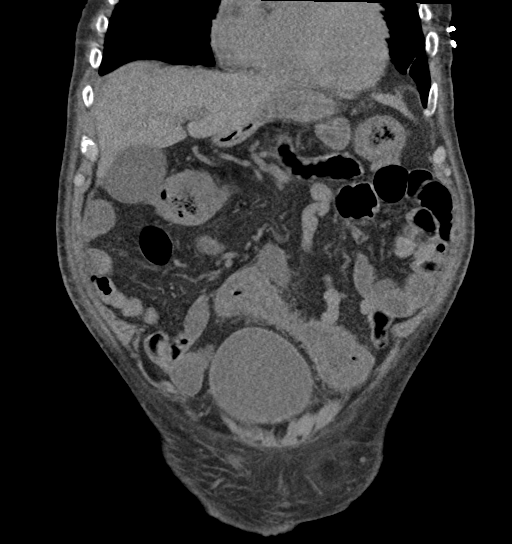
[im 41/93  soft-tissue]
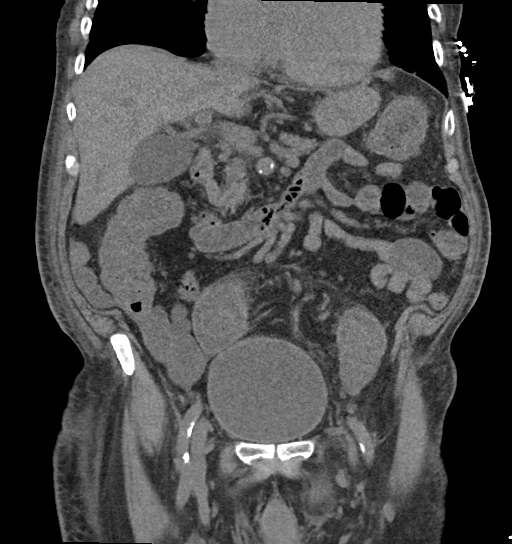
[im 52/93  soft-tissue]
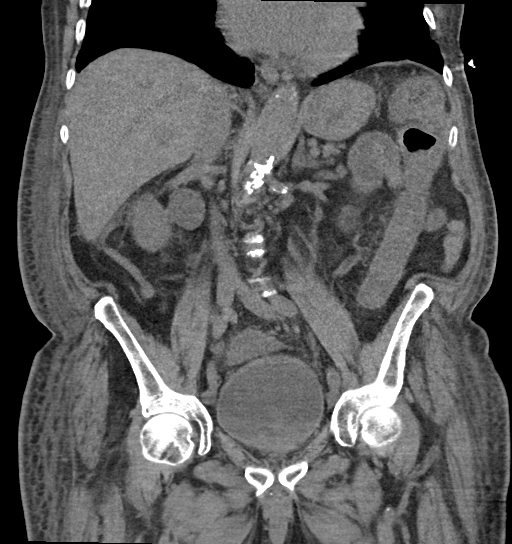

[16 of 46 positions shown; findings below may reference images not displayed]

FINDINGS: Evaluation of this exam is limited in the absence of intravenous
contrast.

Lower chest: Partially visualized small right and probable trace
left pleural effusion. There is hypoattenuation of the cardiac blood
pool suggestive of a degree of anemia. Clinical correlation is
recommended. There is coronary vascular calcification.

No intra-abdominal free air or free fluid.

Hepatobiliary: The liver is unremarkable. No intrahepatic biliary
ductal dilatation. There is probable small amount of sludge within
the gallbladder. No calcified stone. No pericholecystic fluid.

Pancreas: Unremarkable. No pancreatic ductal dilatation or
surrounding inflammatory changes.

Spleen: Normal in size without focal abnormality.

Adrenals/Urinary Tract: The adrenal glands are unremarkable. There
is mild right hydronephroureter. No obstructing stone. The left
kidney is unremarkable the urinary bladder is partially distended
and appears grossly unremarkable for the degree of distention

Stomach/Bowel: There is diffuse thickened and inflamed appearance of
the colon consistent with pancolitis. Correlation with clinical exam
and stool cultures recommended there is no bowel obstruction. The
appendix is normal.

Vascular/Lymphatic: Advanced aortoiliac atherosclerotic disease. No
portal venous gas. There is no adenopathy.

Reproductive: The prostate gland is enlarged and somewhat asymmetric
and nodular. It measures approximately 6 cm in transverse diameter.
Clinical correlation is recommended.

Other: There is diffuse stranding of the mesentery and subcutaneous
edema. Small amount of fluid noted in the left inguinal canal.

Musculoskeletal: Degenerative changes of the spine. No acute osseous
pathology.
IMPRESSION: 1. Pancolitis. Correlation with clinical exam and stool cultures
recommended. No bowel obstruction. Normal appendix.
2. Partially visualized small pleural effusions. Diffuse mesenteric
edema, and anasarca
3. Anemia.
4. Enlarged prostate gland.
5. Mild right hydronephroureter relatively similar to prior CT.
6. Advanced Aortic Atherosclerosis ([id number]).
